# Patient Record
Sex: Female | Born: 1995 | Race: Black or African American | Hispanic: No | Marital: Single | Smoking: Current every day smoker
Health system: Southern US, Community
[De-identification: ages and names within clinical notes are randomized; demographics above are authoritative.]

## PROBLEM LIST (undated history)

## (undated) DIAGNOSIS — J45909 Unspecified asthma, uncomplicated: Secondary | ICD-10-CM

---

## 2013-05-02 ENCOUNTER — Emergency Department (HOSPITAL_COMMUNITY)
Admission: EM | Admit: 2013-05-02 | Discharge: 2013-05-02 | Disposition: A | Discharge status: Home / Self Care | Payer: Medicaid Other | Attending: Emergency Medicine | Admitting: Emergency Medicine

## 2013-05-02 ENCOUNTER — Encounter (HOSPITAL_COMMUNITY): Payer: Self-pay | Admitting: Emergency Medicine

## 2013-05-02 DIAGNOSIS — J45909 Unspecified asthma, uncomplicated: Secondary | ICD-10-CM | POA: Insufficient documentation

## 2013-05-02 DIAGNOSIS — R51 Headache: Secondary | ICD-10-CM | POA: Insufficient documentation

## 2013-05-02 DIAGNOSIS — J069 Acute upper respiratory infection, unspecified: Secondary | ICD-10-CM | POA: Insufficient documentation

## 2013-05-02 DIAGNOSIS — Z79899 Other long term (current) drug therapy: Secondary | ICD-10-CM | POA: Insufficient documentation

## 2013-05-02 HISTORY — DX: Unspecified asthma, uncomplicated: J45.909

## 2013-05-02 LAB — RAPID STREP SCREEN (MED CTR MEBANE ONLY): Streptococcus, Group A Screen (Direct): NEGATIVE

## 2013-05-02 MED ORDER — ALBUTEROL SULFATE HFA 108 (90 BASE) MCG/ACT IN AERS
2.0000 | INHALATION_SPRAY | RESPIRATORY_TRACT | Status: DC | PRN
  Administered 2013-05-02: 2 via RESPIRATORY_TRACT
  Filled 2013-05-02: qty 6.7

## 2013-05-02 NOTE — ED Provider Notes (Signed)
History  This chart was scribed for [name], MD by Ardelia Mems, ED Scribe. This patient was seen in room TR08C/TR08C and the patient's care was started at 3:18 PM.   CSN: [id number]  Arrival date & time 05/02/13  1328   Chief Complaint  Patient presents with  . URI    The history is provided by the patient. No language interpreter was used.    HPI Comments: Jean Navarro is a 17 y.o. female with a h/o asthma who presents to the Emergency Department complaining of 2 days of constant, moderate cold symptoms. Pt reports having a a constant, moderate productive cough of yellow sputum, rhinorrhea, headaches, and nasal congestion. Pt reports being around family members with similar symptoms. Pt is prescribed an albuterol inhaler which she uses when needed. Pt denies fever, chills, vomiting, diarrhea or any other symptoms. Pt denies smoking and alcohol use.  Past Medical History  Diagnosis Date  . Asthma     No past surgical history on file.  No family history on file.  History  Substance Use Topics  . Smoking status: Never Smoker   . Smokeless tobacco: Not on file  . Alcohol Use: No    OB History   Grav Para Term Preterm Abortions TAB SAB Ect Mult Living                  Review of Systems  Constitutional: Negative for fever and chills.  HENT: Positive for congestion and rhinorrhea.   Respiratory: Positive for cough.   Neurological: Positive for headaches.    Allergies  Review of patient's allergies indicates no known allergies.  Home Medications   Current Outpatient Rx  Name  Route  Sig  Dispense  Refill  . albuterol (PROVENTIL HFA;VENTOLIN HFA) 108 (90 BASE) MCG/ACT inhaler   Inhalation   Inhale 2 puffs into the lungs every 6 (six) hours as needed for wheezing.           Triage Vitals: BP 115/71  Pulse 106  Temp(Src) 99 F (37.2 C)  Resp 16  SpO2 99%  Physical Exam  Nursing note and vitals reviewed. Constitutional: She is oriented to person,  place, and time. She appears well-developed and well-nourished.  HENT:  Head: Normocephalic and atraumatic.  TMs are clear bilaterally. Oropharynx without erythema. Bilateral tonsillar swelling without exudates. Uvula midline. Nasal mucosa swollen, without erythema. She has anterior cervical adenopathy.  Eyes: EOM are normal. Pupils are equal, round, and reactive to light.  Neck: Normal range of motion. No tracheal deviation present.  Cardiovascular: Normal rate and normal heart sounds.   No murmur heard. Pulmonary/Chest: Effort normal. No respiratory distress. She has no wheezes. She has no rales.  Abdominal: Soft. There is no tenderness.  Musculoskeletal: Normal range of motion.  Neurological: She is alert and oriented to person, place, and time.  Skin: Skin is warm. No rash noted.  Psychiatric: She has a normal mood and affect.    ED Course  Procedures (including critical care time)  DIAGNOSTIC STUDIES: Oxygen Saturation is 99% on RA, normal by my interpretation.    COORDINATION OF CARE: 3:50 PM- Pt advised of plan for treatment and pt agrees.     Labs Reviewed  RAPID STREP SCREEN  CULTURE, GROUP A STREP   No results found.   No diagnosis found.  1. URI  MDM  URI without complicating symptoms.          I personally performed the services described in this documentation, which  was scribed in my presence. The recorded information has been reviewed and is accurate.     Arnoldo Hooker, PA-C 05/02/13 1608

## 2013-05-02 NOTE — ED Notes (Addendum)
C/o sore throat, cough, nasal drainage x 3 days. States sometimes vomits with coughing spells. Tonsils appear swollen.

## 2013-05-02 NOTE — ED Notes (Signed)
Cold s/s x 2 days h/a and coughing

## 2013-05-03 NOTE — ED Provider Notes (Signed)
Medical screening examination/treatment/procedure(s) were performed by non-physician practitioner and as supervising physician I was immediately available for consultation/collaboration.  [name], MD 05/03/13 [phone]

## 2013-05-04 LAB — CULTURE, GROUP A STREP

## 2013-05-26 ENCOUNTER — Emergency Department (HOSPITAL_COMMUNITY)
Admission: EM | Admit: 2013-05-26 | Discharge: 2013-05-26 | Disposition: A | Discharge status: Home / Self Care | Payer: Medicaid Other | Attending: Emergency Medicine | Admitting: Emergency Medicine

## 2013-05-26 ENCOUNTER — Encounter (HOSPITAL_COMMUNITY): Payer: Self-pay | Admitting: *Deleted

## 2013-05-26 DIAGNOSIS — J45909 Unspecified asthma, uncomplicated: Secondary | ICD-10-CM | POA: Insufficient documentation

## 2013-05-26 DIAGNOSIS — R197 Diarrhea, unspecified: Secondary | ICD-10-CM | POA: Insufficient documentation

## 2013-05-26 DIAGNOSIS — N72 Inflammatory disease of cervix uteri: Secondary | ICD-10-CM | POA: Insufficient documentation

## 2013-05-26 DIAGNOSIS — R112 Nausea with vomiting, unspecified: Secondary | ICD-10-CM | POA: Insufficient documentation

## 2013-05-26 DIAGNOSIS — Z3202 Encounter for pregnancy test, result negative: Secondary | ICD-10-CM | POA: Insufficient documentation

## 2013-05-26 LAB — URINE MICROSCOPIC-ADD ON

## 2013-05-26 LAB — POCT I-STAT, CHEM 8
BUN: 11 mg/dL (ref 6–23)
Calcium, Ion: 1.14 mmol/L (ref 1.12–1.23)
Chloride: 104 meq/L (ref 96–112)
Creatinine, Ser: 1.1 mg/dL (ref 0.50–1.10)
Glucose, Bld: 91 mg/dL (ref 70–99)
HCT: 44 % (ref 36.0–46.0)
Hemoglobin: 15 g/dL (ref 12.0–15.0)
Potassium: 3.4 mEq/L — ABNORMAL LOW (ref 3.5–5.1)
Sodium: 142 meq/L (ref 135–145)
TCO2: 25 mmol/L (ref 0–100)

## 2013-05-26 LAB — URINALYSIS, ROUTINE W REFLEX MICROSCOPIC
Bilirubin Urine: NEGATIVE
Ketones, ur: NEGATIVE mg/dL
Specific Gravity, Urine: 1.021 (ref 1.005–1.030)
Urobilinogen, UA: 1 mg/dL (ref 0.0–1.0)

## 2013-05-26 LAB — CBC WITH DIFFERENTIAL/PLATELET
Basophils Relative: 1 % (ref 0–1)
Eosinophils Relative: 4 % (ref 0–5)
HCT: 41.6 % (ref 36.0–46.0)
Hemoglobin: 14.6 g/dL (ref 12.0–15.0)
MCH: 29.7 pg (ref 26.0–34.0)
MCHC: 35.1 g/dL (ref 30.0–36.0)
MCV: 84.7 fL (ref 78.0–100.0)
Monocytes Absolute: 0.7 10*3/uL (ref 0.1–1.0)
Monocytes Relative: 8 % (ref 3–12)
Neutro Abs: 4.5 10*3/uL (ref 1.7–7.7)
RDW: 11.8 % (ref 11.5–15.5)

## 2013-05-26 LAB — WET PREP, GENITAL
Trich, Wet Prep: NONE SEEN
Yeast Wet Prep HPF POC: NONE SEEN

## 2013-05-26 MED ORDER — MORPHINE SULFATE 4 MG/ML IJ SOLN
4.0000 mg | Freq: Once | INTRAMUSCULAR | Status: AC
  Administered 2013-05-26: 4 mg via INTRAVENOUS
  Filled 2013-05-26: qty 1

## 2013-05-26 MED ORDER — CEFTRIAXONE SODIUM 250 MG IJ SOLR
250.0000 mg | Freq: Once | INTRAMUSCULAR | Status: AC
  Administered 2013-05-26: 250 mg via INTRAMUSCULAR
  Filled 2013-05-26: qty 250

## 2013-05-26 MED ORDER — ONDANSETRON HCL 4 MG/2ML IJ SOLN
4.0000 mg | Freq: Once | INTRAMUSCULAR | Status: AC
  Administered 2013-05-26: 4 mg via INTRAVENOUS
  Filled 2013-05-26: qty 2

## 2013-05-26 MED ORDER — LIDOCAINE HCL (PF) 1 % IJ SOLN
INTRAMUSCULAR | Status: AC
  Filled 2013-05-26: qty 5

## 2013-05-26 MED ORDER — ONDANSETRON HCL 4 MG PO TABS: 4.0000 mg | ORAL_TABLET | Freq: Four times a day (QID) | ORAL | Status: DC

## 2013-05-26 MED ORDER — LIDOCAINE HCL (PF) 1 % IJ SOLN
1.0000 mL | Freq: Once | INTRAMUSCULAR | Status: AC
  Administered 2013-05-26: 1 mL

## 2013-05-26 MED ORDER — SODIUM CHLORIDE 0.9 % IV BOLUS (SEPSIS)
1000.0000 mL | Freq: Once | INTRAVENOUS | Status: AC
  Administered 2013-05-26: 1000 mL via INTRAVENOUS

## 2013-05-26 MED ORDER — AZITHROMYCIN 250 MG PO TABS
1000.0000 mg | ORAL_TABLET | Freq: Once | ORAL | Status: AC
  Administered 2013-05-26: 1000 mg via ORAL
  Filled 2013-05-26: qty 4

## 2013-05-26 NOTE — ED Notes (Signed)
C/o abd. Pain onset 2 days c/o n/v denies urinary sx. Denies vag. D/c.

## 2013-05-26 NOTE — ED Provider Notes (Signed)
Medical screening examination/treatment/procedure(s) were performed by non-physician practitioner and as supervising physician I was immediately available for consultation/collaboration.   Gavin Pound. Niharika Savino, MD 05/26/13 1115
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
## 2013-05-27 LAB — GC/CHLAMYDIA PROBE AMP
CT Probe RNA: POSITIVE — AB
GC Probe RNA: NEGATIVE

## 2013-05-30 NOTE — ED Notes (Addendum)
+   Chlamydia-patient treated with Rocephin and Zithromax-[facility]

## 2013-06-01 ENCOUNTER — Telehealth (HOSPITAL_COMMUNITY): Payer: Self-pay | Admitting: Emergency Medicine

## 2013-06-06 ENCOUNTER — Telehealth (HOSPITAL_COMMUNITY): Payer: Self-pay | Admitting: Emergency Medicine

## 2013-10-14 ENCOUNTER — Emergency Department (HOSPITAL_COMMUNITY): Payer: Medicaid Other

## 2013-10-14 ENCOUNTER — Emergency Department (HOSPITAL_COMMUNITY)
Admission: EM | Admit: 2013-10-14 | Discharge: 2013-10-14 | Disposition: A | Discharge status: Home / Self Care | Payer: Medicaid Other | Attending: Emergency Medicine | Admitting: Emergency Medicine

## 2013-10-14 ENCOUNTER — Encounter (HOSPITAL_COMMUNITY): Payer: Self-pay | Admitting: Emergency Medicine

## 2013-10-14 DIAGNOSIS — R51 Headache: Secondary | ICD-10-CM | POA: Insufficient documentation

## 2013-10-14 DIAGNOSIS — F1210 Cannabis abuse, uncomplicated: Secondary | ICD-10-CM | POA: Insufficient documentation

## 2013-10-14 DIAGNOSIS — F29 Unspecified psychosis not due to a substance or known physiological condition: Secondary | ICD-10-CM | POA: Insufficient documentation

## 2013-10-14 DIAGNOSIS — R413 Other amnesia: Secondary | ICD-10-CM | POA: Insufficient documentation

## 2013-10-14 DIAGNOSIS — J45909 Unspecified asthma, uncomplicated: Secondary | ICD-10-CM | POA: Insufficient documentation

## 2013-10-14 DIAGNOSIS — F17200 Nicotine dependence, unspecified, uncomplicated: Secondary | ICD-10-CM | POA: Insufficient documentation

## 2013-10-14 DIAGNOSIS — Z3202 Encounter for pregnancy test, result negative: Secondary | ICD-10-CM | POA: Insufficient documentation

## 2013-10-14 DIAGNOSIS — T169XXA Foreign body in ear, unspecified ear, initial encounter: Secondary | ICD-10-CM | POA: Insufficient documentation

## 2013-10-14 DIAGNOSIS — N949 Unspecified condition associated with female genital organs and menstrual cycle: Secondary | ICD-10-CM | POA: Insufficient documentation

## 2013-10-14 DIAGNOSIS — Z79899 Other long term (current) drug therapy: Secondary | ICD-10-CM | POA: Insufficient documentation

## 2013-10-14 DIAGNOSIS — R450 Nervousness: Secondary | ICD-10-CM | POA: Insufficient documentation

## 2013-10-14 DIAGNOSIS — T162XXA Foreign body in left ear, initial encounter: Secondary | ICD-10-CM

## 2013-10-14 DIAGNOSIS — Y929 Unspecified place or not applicable: Secondary | ICD-10-CM | POA: Insufficient documentation

## 2013-10-14 DIAGNOSIS — IMO0002 Reserved for concepts with insufficient information to code with codable children: Secondary | ICD-10-CM | POA: Insufficient documentation

## 2013-10-14 DIAGNOSIS — Y939 Activity, unspecified: Secondary | ICD-10-CM | POA: Insufficient documentation

## 2013-10-14 DIAGNOSIS — F411 Generalized anxiety disorder: Secondary | ICD-10-CM | POA: Insufficient documentation

## 2013-10-14 LAB — RAPID URINE DRUG SCREEN, HOSP PERFORMED
Amphetamines: NOT DETECTED
Benzodiazepines: NOT DETECTED
Cocaine: NOT DETECTED
Opiates: NOT DETECTED
Tetrahydrocannabinol: POSITIVE — AB

## 2013-10-14 LAB — URINE MICROSCOPIC-ADD ON

## 2013-10-14 LAB — COMPREHENSIVE METABOLIC PANEL
ALT: 12 U/L (ref 0–35)
AST: 22 U/L (ref 0–37)
CO2: 22 mEq/L (ref 19–32)
Calcium: 9.7 mg/dL (ref 8.4–10.5)
Chloride: 102 mEq/L (ref 96–112)
Creatinine, Ser: 0.92 mg/dL (ref 0.50–1.10)
GFR calc Af Amer: >90 mL/min (ref >90)
GFR calc non Af Amer: 90 mL/min — ABNORMAL LOW (ref >90)
Glucose, Bld: 74 mg/dL (ref 70–99)
Sodium: 138 mEq/L (ref 135–145)
Total Bilirubin: 0.4 mg/dL (ref 0.3–1.2)

## 2013-10-14 LAB — CBC WITH DIFFERENTIAL/PLATELET
Basophils Absolute: 0.1 10*3/uL (ref 0.0–0.1)
Eosinophils Relative: 0 % (ref 0–5)
Hemoglobin: 14.9 g/dL (ref 12.0–15.0)
Lymphs Abs: 1.4 10*3/uL (ref 0.7–4.0)
MCH: 30.7 pg (ref 26.0–34.0)
MCHC: 36.1 g/dL — ABNORMAL HIGH (ref 30.0–36.0)
Monocytes Absolute: 0.7 10*3/uL (ref 0.1–1.0)
Neutrophils Relative %: 84 % — ABNORMAL HIGH (ref 43–77)
Platelets: 205 10*3/uL (ref 150–400)
RDW: 10.8 % — ABNORMAL LOW (ref 11.5–15.5)

## 2013-10-14 LAB — URINALYSIS, ROUTINE W REFLEX MICROSCOPIC
Bilirubin Urine: NEGATIVE
Hgb urine dipstick: NEGATIVE
Specific Gravity, Urine: 1.028 (ref 1.005–1.030)
pH: 7 (ref 5.0–8.0)

## 2013-10-14 MED ORDER — LEVONORGESTREL 0.75 MG PO TABS
1.5000 mg | ORAL_TABLET | Freq: Once | ORAL | Status: AC
  Administered 2013-10-14: 1.5 mg via ORAL
  Filled 2013-10-14: qty 2

## 2013-10-14 MED ORDER — CEFIXIME 400 MG PO TABS
400.0000 mg | ORAL_TABLET | Freq: Once | ORAL | Status: AC
  Administered 2013-10-14: 400 mg via ORAL
  Filled 2013-10-14: qty 1

## 2013-10-14 MED ORDER — AZITHROMYCIN 1 G PO PACK
1.0000 g | PACK | Freq: Once | ORAL | Status: AC
  Administered 2013-10-14: 1 g via ORAL

## 2013-10-14 MED ORDER — AZITHROMYCIN 250 MG PO TABS: 1000.0000 mg | ORAL_TABLET | Freq: Once | ORAL | Status: DC

## 2013-10-14 MED ORDER — METRONIDAZOLE 500 MG PO TABS: 500.0000 mg | ORAL_TABLET | Freq: Once | ORAL | Status: DC

## 2013-10-14 MED ORDER — METRONIDAZOLE 500 MG PO TABS
2000.0000 mg | ORAL_TABLET | Freq: Once | ORAL | Status: AC
  Administered 2013-10-14: 2000 mg via ORAL

## 2013-10-14 MED ORDER — CEFTRIAXONE SODIUM 250 MG IJ SOLR: 250.0000 mg | Freq: Once | INTRAMUSCULAR | Status: DC

## 2013-10-14 MED ORDER — PROMETHAZINE HCL 25 MG PO TABS
25.0000 mg | ORAL_TABLET | Freq: Four times a day (QID) | ORAL | Status: DC | PRN
  Administered 2013-10-14: 75 mg via ORAL
  Filled 2013-10-14: qty 1

## 2013-10-14 MED ORDER — ACETAMINOPHEN 325 MG PO TABS
650.0000 mg | ORAL_TABLET | Freq: Once | ORAL | Status: AC
  Administered 2013-10-14: 650 mg via ORAL
  Filled 2013-10-14: qty 2
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
## 2013-10-14 NOTE — ED Notes (Signed)
SANE RN at bedside.
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
## 2013-10-14 NOTE — ED Notes (Signed)
SANE nurse called.

## 2013-10-16 NOTE — ED Provider Notes (Signed)
Medical screening examination/treatment/procedure(s) were performed by non-physician practitioner and as supervising physician I was immediately available for consultation/collaboration.  EKG Interpretation     Ventricular Rate:  62 PR Interval:  123 QRS Duration: 67 QT Interval:  402 QTC Calculation: 408 R Axis:   81 Text Interpretation:  Sinus rhythm ED PHYSICIAN INTERPRETATION AVAILABLE IN [facility]              Enid Skeens, MD 10/16/13 [phone]

## 2016-07-11 ENCOUNTER — Inpatient Hospital Stay: Admit: 2016-07-11 | Discharge: 2016-07-11 | Disposition: A | Discharge status: Home / Self Care

## 2016-07-11 DIAGNOSIS — N898 Other specified noninflammatory disorders of vagina: Secondary | ICD-10-CM

## 2016-07-11 LAB — TRICHOMONAS SCREEN
Clue Cells, Wet Prep: ABSENT
Trich, Wet Prep: ABSENT

## 2016-07-11 LAB — CHLAMYDIA / GONORRHOEAE DNA SWAB
Chlamydia Trachomatis DNA Swab: NEGATIVE
Neisseria gonorrhoeae DNA Swab: NEGATIVE

## 2016-07-11 LAB — HCG URINE, QUALITATIVE: Preg Test, Ur: NEGATIVE

## 2016-07-11 LAB — TREPONEMA PALLIDUM AB WITH REFLEX: Treponema Pallidum: NEGATIVE

## 2016-07-11 MED ORDER — metroNIDAZOLE (FLAGYL) tablet 2,000 mg
500 | Freq: Once | ORAL | Status: AC
Start: 2016-07-11 — End: 2016-07-11
  Administered 2016-07-11: 16:00:00 2000 mg via ORAL

## 2016-07-11 MED ORDER — ondansetron (ZOFRAN-ODT) disintegrating tablet 4 mg
4 | Freq: Once | ORAL | Status: AC
Start: 2016-07-11 — End: 2016-07-11
  Administered 2016-07-11: 16:00:00 4 mg via ORAL

## 2016-07-11 MED ORDER — fluconazole (DIFLUCAN) tablet 150 mg
150 | Freq: Once | ORAL | Status: AC
Start: 2016-07-11 — End: 2016-07-11
  Administered 2016-07-11: 16:00:00 150 mg via ORAL

## 2016-07-11 MED ORDER — cefTRIAXone (ROCEPHIN) injection 250 mg
250 | Freq: Once | INTRAMUSCULAR | Status: AC
Start: 2016-07-11 — End: 2016-07-11
  Administered 2016-07-11: 16:00:00 250 mg via INTRAMUSCULAR

## 2016-07-11 MED ORDER — azithromycin (ZITHROMAX) tablet 1,000 mg
250 | Freq: Once | ORAL | Status: AC
Start: 2016-07-11 — End: 2016-07-11
  Administered 2016-07-11: 16:00:00 1000 mg via ORAL

## 2016-07-11 MED ORDER — metroNIDAZOLE (FLAGYL) 500 MG tablet
500 | ORAL_TABLET | Freq: Two times a day (BID) | ORAL | Status: AC
Start: 2016-07-11 — End: 2016-07-18

## 2016-07-11 MED FILL — ONDANSETRON 4 MG DISINTEGRATING TABLET: 4 4 MG | ORAL | Qty: 1

## 2016-07-11 MED FILL — FLUCONAZOLE 150 MG TABLET: 150 150 MG | ORAL | Qty: 1

## 2016-07-11 MED FILL — AZITHROMYCIN 250 MG TABLET: 250 250 MG | ORAL | Qty: 4

## 2016-07-11 MED FILL — CEFTRIAXONE 250 MG SOLUTION FOR INJECTION: 250 250 mg | INTRAMUSCULAR | Qty: 250

## 2016-07-11 MED FILL — METRONIDAZOLE 500 MG TABLET: 500 500 MG | ORAL | Qty: 4
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
## 2016-07-11 NOTE — Unmapped (Signed)
Pt presents to [facility] with c/o vaginal discharge x1 month.

## 2016-07-11 NOTE — Unmapped (Signed)
ED Attending Attestation Note    Date of service:  07/11/2016    This patient was seen by the resident physician.  I have seen and examined the patient, agree with the workup, evaluation, management and diagnosis. The care plan has been discussed and I concur.     My assessment reveals a 20 y.o. female with vaginal discharge.  Workup and physical most consistent with bacterial vaginitis.

## 2016-07-11 NOTE — Unmapped (Signed)
Counselor completed Rapid HIV test. Counseling completed. Result Negative

## 2016-07-11 NOTE — Unmapped (Signed)
Thank you for choosing the [facility] Emergency [facility].     You were seen today for vaginal discharge.     When you go home, you will need to take a pill called Flagyl. Take this pill two times a day.     Please follow-up with a primary doctor in 5-7 days. Return to the ED if you develop fevers, severe abdominal pain, persistent vomiting, or other concerning symptoms.

## 2016-11-14 NOTE — Telephone Encounter (Signed)
Pt desires to ask medical questions. Informed pt she needs to contact [facility] where she receives her prenatal care from.

## 2016-11-15 ENCOUNTER — Ambulatory Visit: Admit: 2016-11-15 | Payer: MEDICAID | Attending: Maternal & Fetal Medicine | Primary: Dental Public Health

## 2016-11-15 DIAGNOSIS — O209 Hemorrhage in early pregnancy, unspecified: Secondary | ICD-10-CM

## 2016-11-15 NOTE — Progress Notes (Signed)
OB GYN BILLING ONLY

## 2016-12-11 ENCOUNTER — Ambulatory Visit: Payer: PRIVATE HEALTH INSURANCE | Primary: Dental Public Health

## 2017-01-08 DIAGNOSIS — O26891 Other specified pregnancy related conditions, first trimester: Secondary | ICD-10-CM

## 2017-01-08 MED ORDER — acetaminophen (TYLENOL) tablet 975 mg
325 | Freq: Once | ORAL | Status: AC
Start: 2017-01-08 — End: 2017-01-08
  Administered 2017-01-09: 03:00:00 975 mg via ORAL

## 2017-01-08 MED ORDER — sodium chloride 0.9 % 1,000 mL IV fluid
Freq: Once | INTRAVENOUS | Status: AC
Start: 2017-01-08 — End: 2017-01-08
  Administered 2017-01-09: 03:00:00 via INTRAVENOUS

## 2017-01-08 NOTE — ED Notes (Signed)
Discharge instructions reviewed with pt. No prescription given. Pt ambulates to lobby with a steady gait. A&OX4.

## 2017-01-08 NOTE — ED Triage Notes (Signed)
abd pain and HA for 1 week [redacted] wks pregnant.

## 2017-01-08 NOTE — ED Notes (Signed)
MD at bedside preforming ultrasound.

## 2017-01-08 NOTE — Unmapped (Signed)
Follow-up with your [facility] doctor as directed.  You can take over-the-counter Tylenol as needed for pain.  Drink plenty of fluids.  Return to the emergency department for any concerns.

## 2017-01-08 NOTE — Unmapped (Signed)
[facility] ED Note    Date of service:  01/08/2017    Reason for Visit: Abdominal Pain Pregnant      Patient History     HPI    This is a 21 year old after American female who presents with a headache.  She states she has had the headache intermittently For the last week.  She describes it as right frontal in location.  She rates it as severe.  It was not sudden in onset.  She she states it is not the worse headache of her life.  She last had headaches like this last year.  She denies any recent trauma.  She denies any neck pain or back pain.  She denies any fevers or chills.  She denies any recent sick contacts.  She states that she was concerned that she may have preeclampsia after some reading on the Internet, and came to the emergent permit for evaluation.  She denies any blood pressure problems with this pregnancy.  She states that she has been following up with the [facility] clinic.  She did have a ultrasound at 9 weeks, confirming an intrauterine pregnancy.  She denies any abdominal pain, nausea, vomiting, diarrhea.  No vaginal bleeding or vaginal discharge.  No back pain.  She denies any change in her speech.  No motor weakness or sensory deficits.  She denies changes in her urinary habits as well.     History reviewed. No pertinent past medical history.    Past Surgical History:   Procedure Laterality Date   ??? CARDIAC SURGERY     ??? OVARIAN CYST SURGERY         Patient  reports that she has been smoking Cigars.  She has been smoking about 1.00 pack per day. She does not have any smokeless tobacco history on file. She reports that she uses drugs, including Marijuana. She reports that she does not drink alcohol.      Previous Medications    No medications on file       Allergies:   Allergies as of 01/08/2017 - Fully Reviewed 01/08/2017   Allergen Reaction Noted   ??? Amoxicillin Swelling 07/11/2016       Review of Systems     Review of Systems   Constitutional:  Negative for chills, fatigue and fever.   Eyes: Negative for photophobia and visual disturbance.   Respiratory: Negative for chest tightness and shortness of breath.    Cardiovascular: Negative for chest pain.   Gastrointestinal: Positive for abdominal pain. Negative for diarrhea, nausea and vomiting.   Genitourinary: Negative for difficulty urinating, dysuria, frequency, hematuria, pelvic pain, vaginal bleeding and vaginal discharge.   Musculoskeletal: Negative for neck stiffness.   Skin: Negative for rash.   Neurological: Positive for headaches. Negative for dizziness, facial asymmetry, speech difficulty, weakness, light-headedness and numbness.   Hematological: Does not bruise/bleed easily.           Physical Exam     ED Triage Vitals [01/08/17 1924]   Vital Signs Group      Temp 98.9 ??F (37.2 ??C)      Temp Source Oral      Heart Rate 84      Heart Rate Source Automatic      Resp 20      SpO2 98 %      BP 109/58      BP Location Right arm      BP Method Automatic      Patient Position  Sitting   SpO2 98 %   O2 Device None (Room air)       Physical Exam   Constitutional: She appears well-developed and well-nourished.   HENT:   Head: Normocephalic and atraumatic.   Right Ear: External ear normal.   Left Ear: External ear normal.   Nose: Nose normal.   Mouth/Throat: Oropharynx is clear and moist.   Eyes: EOM are normal. Pupils are equal, round, and reactive to light.   Neck: Normal range of motion. Neck supple.   Cardiovascular: Normal rate, regular rhythm, normal heart sounds and intact distal pulses.  Exam reveals no gallop and no friction rub.    No murmur heard.  Pulmonary/Chest: Effort normal and breath sounds normal. She has no wheezes. She has no rales.   Abdominal: Soft. Bowel sounds are normal. She exhibits no distension. There is no tenderness. There is no rebound and no guarding.   Musculoskeletal: Normal range of motion. She exhibits no edema.   Neurological: She is alert.  She is oriented to person,  place, time and situation.  Normal speech without aphasia or dysarthria.  Moves all extremities spontaneosly and symmetrically.  She has normal strength. No cranial nerve deficit or sensory deficit. Gait is normal without ataxia.  Coordination normal. GCS eye subscore is 4. GCS verbal subscore is 5. GCS motor subscore is 6.   Skin: Skin is warm and dry. No rash noted.   Psychiatric: She has a normal mood and affect. Her behavior is normal.         Diagnostic Studies     Labs:    Please see electronic medical record for any tests performed in the ED    Radiology:    Please see electronic medical record for any tests performed in the ED    EKG:    No EKG Performed    Emergency Department Procedures     Procedures    ED Course and MDM     Christina Mckay is a 21 y.o. female who presented to the emergency department with Abdominal Pain Pregnant      ED Course        This is a 21 year old African female who presents with headache.  The triage note noted abdominal pain, but the patient denies that here today.  She is very well-appearing and does not appear uncomfortable.  She has no evidence of nuchal rigidity or meningismus.  I very low suspicion for severe intracranial pathology, to include masses or hemorrhage.  This is been a headache that has been going on for approximately one week.  She states that it doesn't get better when she drinks fluid or sleep.  She has not tried any over-the-counter medications such as Tylenol.  She is predominately worried about preeclampsia, but her blood pressure is normal here in the emergency department.  We will try hydration and Tylenol.  I do not think that she needed to cranial imaging.  This does not have a characteristic of a subarachnoid hemorrhage.  This is not the worse headache of her life, nor was it sudden in onset.     The patient had a limited bedside ultrasound, that demonstrated a intra-uterine pregnancy with fetal cardiac activity.  The fetus had movement as well.   Patient was given 975 mg of Tylenol, and a liter of IV fluids, with complete resolution of her pain.  I think this is a simple uncomplicated headache, related to hydration and fatigue.  She is urged to get plenty  of rest.  She is urged to eat and drink normally.  She is urged follow-up with a primary care physician and her OB doctor.  Her blood pressures were normal here.  She has no evidence of preeclampsia.  Her labs are normal.  She feels well and is nontoxic, and is discharged in stable condition.       Critical Care Time (Attendings)          Gareth Morgan, MD  01/08/17 [phone]

## 2017-01-09 ENCOUNTER — Inpatient Hospital Stay
Admit: 2017-01-09 | Discharge: 2017-01-09 | Disposition: A | Discharge status: Home / Self Care | Payer: PRIVATE HEALTH INSURANCE

## 2017-01-09 LAB — URINALYSIS W/RFL MICROSCOP, RFL CULTURE
Bilirubin, UA: NEGATIVE
Blood, UA: NEGATIVE
Glucose, UA: NEGATIVE mg/dL
Ketones, UA: NEGATIVE mg/dL
Nitrite, UA: NEGATIVE
Protein, UA: 30 mg/dL
RBC, UA: 1 /HPF (ref 0–3)
Specific Gravity, UA: 1.027 (ref 1.005–1.035)
Squam Epithel, UA: 28 /HPF (ref 0–5)
Urobilinogen, UA: <2 mg/dL (ref 0.2–1.9)
WBC, UA: 5 /HPF (ref 0–5)
pH, UA: 7 (ref 5.0–8.0)

## 2017-01-09 LAB — BASIC METABOLIC PANEL
Anion Gap: 10 mmol/L (ref 3–16)
BUN: 12 mg/dL (ref 7–25)
CO2: 21 mmol/L (ref 21–33)
Calcium: 9.3 mg/dL (ref 8.6–10.3)
Chloride: 105 mmol/L (ref 98–110)
Creatinine: 0.94 mg/dL (ref 0.60–1.30)
Glucose: 78 mg/dL (ref 70–100)
Osmolality, Calculated: 281 mOsm/kg (ref 278–305)
Potassium: 3.5 mmol/L (ref 3.5–5.3)
Sodium: 136 mmol/L (ref 133–146)
eGFR AA CKD-EPI: >90 See note.
eGFR NONAA CKD-EPI: 86 See note.

## 2017-01-09 LAB — DIFFERENTIAL
Basophils Absolute: 42 /uL (ref 0–200)
Basophils Relative: 0.3 % (ref 0.0–1.0)
Eosinophils Absolute: 167 /uL (ref 15–500)
Eosinophils Relative: 1.2 % (ref 0.0–8.0)
Lymphocytes Absolute: 2335 /uL (ref 850–3900)
Lymphocytes Relative: 16.8 % (ref 15.0–45.0)
Monocytes Absolute: 834 /uL (ref 200–950)
Monocytes Relative: 6 % (ref 0.0–12.0)
Neutrophils Absolute: 10522 /uL (ref 1500–7800)
Neutrophils Relative: 75.7 % (ref 40.0–80.0)
nRBC: 0 /100 WBC (ref 0–0)

## 2017-01-09 LAB — HEPATIC FUNCTION PANEL
ALT: 6 U/L — ABNORMAL LOW (ref 7–52)
AST: 14 U/L (ref 13–39)
Albumin: 3.7 g/dL (ref 3.5–5.7)
Alkaline Phosphatase: 17 U/L — ABNORMAL LOW (ref 36–125)
Bilirubin, Direct: 0.1 mg/dL (ref 0.0–0.4)
Bilirubin, Indirect: 0.2 mg/dL (ref 0.0–1.1)
Total Bilirubin: 0.3 mg/dL (ref 0.0–1.5)
Total Protein: 6.8 g/dL (ref 6.4–8.9)

## 2017-01-09 LAB — PREGNANCY QUALITATIVE, SERUM, UCMC ONLY: Preg, Serum: POSITIVE

## 2017-01-09 LAB — CBC
Hematocrit: 36.9 % (ref 35.0–45.0)
Hemoglobin: 13 g/dL (ref 11.7–15.5)
MCH: 31.4 pg (ref 27.0–33.0)
MCHC: 35.3 g/dL (ref 32.0–36.0)
MCV: 88.8 fL (ref 80.0–100.0)
MPV: 8.5 fL (ref 7.5–11.5)
Platelets: 175 10*3/uL (ref 140–400)
RBC: 4.16 10*6/uL (ref 3.80–5.10)
RDW: 12.1 % (ref 11.0–15.0)
WBC: 13.9 10*3/uL (ref 3.8–10.8)

## 2017-01-09 LAB — MAGNESIUM: Magnesium: 1.8 mg/dL (ref 1.5–2.5)

## 2017-01-09 LAB — HCG URINE, QUALITATIVE: Preg Test, Ur: POSITIVE — AB

## 2017-01-19 ENCOUNTER — Other Ambulatory Visit: Admit: 2017-01-19 | Payer: PRIVATE HEALTH INSURANCE | Primary: Dental Public Health

## 2017-01-19 ENCOUNTER — Ambulatory Visit
Admit: 2017-01-19 | Payer: PRIVATE HEALTH INSURANCE | Attending: Maternal & Fetal Medicine | Primary: Dental Public Health

## 2017-01-19 DIAGNOSIS — O35HXX0 Maternal care for other (suspected) fetal abnormality and damage, fetal lower extremities anomalies, not applicable or unspecified: Secondary | ICD-10-CM

## 2017-01-19 DIAGNOSIS — Z3689 Encounter for other specified antenatal screening: Secondary | ICD-10-CM

## 2017-01-19 LAB — CELL FREE FETAL DNA
Chromosome 13: NEGATIVE
Chromosome 18: NEGATIVE
Chromosome 21: NEGATIVE
Y Chromosome: NOT DETECTED

## 2017-01-19 NOTE — Unmapped (Signed)
OBGYN billing only

## 2017-02-05 NOTE — Telephone Encounter (Signed)
Call to inform pt of cffDNA results.  No answer.  LMTRC to [phone].

## 2017-02-19 ENCOUNTER — Ambulatory Visit
Admit: 2017-02-19 | Payer: PRIVATE HEALTH INSURANCE | Attending: Maternal & Fetal Medicine | Primary: Dental Public Health

## 2017-02-19 DIAGNOSIS — O0992 Supervision of high risk pregnancy, unspecified, second trimester: Secondary | ICD-10-CM

## 2017-02-19 NOTE — Progress Notes (Signed)
OB GYN BILLING ONLY

## 2017-03-05 ENCOUNTER — Ambulatory Visit
Admit: 2017-03-05 | Payer: PRIVATE HEALTH INSURANCE | Attending: Maternal & Fetal Medicine | Primary: Dental Public Health

## 2017-03-05 DIAGNOSIS — O365920 Maternal care for other known or suspected poor fetal growth, second trimester, not applicable or unspecified: Secondary | ICD-10-CM

## 2017-03-05 NOTE — Progress Notes (Signed)
OB GYN BILLING ONLY

## 2017-03-13 ENCOUNTER — Ambulatory Visit: Payer: PRIVATE HEALTH INSURANCE | Primary: Dental Public Health

## 2017-03-20 ENCOUNTER — Ambulatory Visit
Admit: 2017-03-20 | Payer: PRIVATE HEALTH INSURANCE | Attending: Maternal & Fetal Medicine | Primary: Dental Public Health

## 2017-03-20 DIAGNOSIS — O365920 Maternal care for other known or suspected poor fetal growth, second trimester, not applicable or unspecified: Secondary | ICD-10-CM

## 2017-03-20 NOTE — Unmapped (Signed)
OBGYN BILLING ONLY

## 2017-03-27 ENCOUNTER — Ambulatory Visit: Payer: PRIVATE HEALTH INSURANCE | Primary: Dental Public Health

## 2017-04-03 ENCOUNTER — Ambulatory Visit
Admit: 2017-04-03 | Payer: PRIVATE HEALTH INSURANCE | Attending: Maternal & Fetal Medicine | Primary: Dental Public Health

## 2017-04-03 DIAGNOSIS — O365930 Maternal care for other known or suspected poor fetal growth, third trimester, not applicable or unspecified: Secondary | ICD-10-CM

## 2017-04-03 NOTE — Unmapped (Signed)
OBGYN billing only

## 2017-04-06 ENCOUNTER — Ambulatory Visit: Payer: PRIVATE HEALTH INSURANCE | Primary: Dental Public Health

## 2017-04-10 ENCOUNTER — Ambulatory Visit
Admit: 2017-04-10 | Payer: PRIVATE HEALTH INSURANCE | Attending: Maternal & Fetal Medicine | Primary: Dental Public Health

## 2017-04-10 DIAGNOSIS — O365930 Maternal care for other known or suspected poor fetal growth, third trimester, not applicable or unspecified: Secondary | ICD-10-CM

## 2017-04-10 NOTE — Unmapped (Signed)
OBGYN billing only

## 2017-04-11 ENCOUNTER — Inpatient Hospital Stay
Admit: 2017-04-11 | Discharge: 2017-04-11 | Disposition: A | Discharge status: Home / Self Care | Payer: PRIVATE HEALTH INSURANCE

## 2017-04-11 DIAGNOSIS — O26893 Other specified pregnancy related conditions, third trimester: Secondary | ICD-10-CM

## 2017-04-11 LAB — URINALYSIS W/RFL MICROSCOP, RFL CULTURE
Bilirubin, UA: NEGATIVE
Blood, UA: NEGATIVE
Glucose, UA: NEGATIVE mg/dL
Ketones, UA: NEGATIVE mg/dL
Nitrite, UA: NEGATIVE
Protein, UA: NEGATIVE mg/dL
RBC, UA: 1 /HPF (ref 0–3)
Specific Gravity, UA: 1.017 (ref 1.005–1.035)
Squam Epithel, UA: 41 /HPF — ABNORMAL HIGH (ref 0–5)
Urobilinogen, UA: <2 mg/dL (ref 0.2–1.9)
pH, UA: 7 (ref 5.0–8.0)

## 2017-04-11 LAB — URINE CULTURE

## 2017-04-11 MED ORDER — cyclobenzaprine (FLEXERIL) 5 MG tablet
5 | ORAL_TABLET | Freq: Three times a day (TID) | ORAL | 0 refills | Status: AC | PRN
Start: 2017-04-11 — End: 2017-04-11

## 2017-04-11 MED ORDER — cyclobenzaprine (FLEXERIL) tablet 10 mg
10 | Freq: Once | ORAL | Status: AC
Start: 2017-04-11 — End: 2017-04-11

## 2017-04-11 MED ORDER — acetaminophen (TYLENOL) tablet 650 mg
325 | Freq: Four times a day (QID) | ORAL | Status: AC | PRN
Start: 2017-04-11 — End: 2017-04-11
  Administered 2017-04-11: 15:00:00 650 mg via ORAL

## 2017-04-11 MED ORDER — cyclobenzaprine (FLEXERIL) 5 MG tablet
5 | ORAL_TABLET | Freq: Three times a day (TID) | ORAL | 0 refills | Status: AC | PRN
Start: 2017-04-11 — End: ?

## 2017-04-11 MED ORDER — cyclobenzaprine (FLEXERIL) 10 MG tablet
10 | ORAL | Status: AC
Start: 2017-04-11 — End: 2017-04-11
  Administered 2017-04-11: 15:00:00 10 via ORAL

## 2017-04-11 MED ORDER — acetaminophen (TYLENOL) 325 MG tablet
325 | ORAL_TABLET | Freq: Four times a day (QID) | ORAL | 0 refills | 11.00000 days | Status: AC | PRN
Start: 2017-04-11 — End: 2017-04-11

## 2017-04-11 MED ORDER — acetaminophen (TYLENOL) 325 MG tablet
325 | ORAL_TABLET | Freq: Four times a day (QID) | ORAL | 0 refills | 11.00000 days | Status: AC | PRN
Start: 2017-04-11 — End: ?

## 2017-04-11 MED FILL — CYCLOBENZAPRINE 10 MG TABLET: 10 10 MG | ORAL | Qty: 1

## 2017-04-11 NOTE — Unmapped (Signed)
[facility] ED OB  Provider Note      Patient Name: Tamikka Pilger  MRN: [id number]  Date of Birth: 13-Sep-1996  Date of Service: 04/11/2017    [facility] Care: [facility]    Subjective:   Mistina Coatney is a 21 y.o. G1P0 at [redacted]w[redacted]d by L=9 wk [place] who presents with back pain. She reports it started 3 days ago. She denies a specific inciting event. She reports trying tylenol with some relief, and took a bath that also helped. She denies dysuria, flank pain, hematuria.  She denies contractions, loss of fluid, vaginal bleeding.  Denies headache, change in vision, RUQ pain.      Antepartum History:  There is no problem list on file for this patient.        OB History:  OB History   Gravida Para Term Preterm AB Living   1             SAB TAB Ectopic Multiple Live Births                  # Outcome Date GA Lbr Len/2nd Weight Sex Delivery Anes PTL Lv   1 Current                   GYN History:  History of abnormal pap smear: denies  History of STI: denies  History of HSV: denies    History:  Past Medical History: No past medical history on file.  Past Surgical History:   Past Surgical History:   Procedure Laterality Date   ??? CARDIAC SURGERY     ??? OVARIAN CYST SURGERY         Social History:   Denies EtOH, tobacco abuse or illicit drug use  Social History     Social History   ??? Marital status: Single     Spouse name: N/A   ??? Number of children: N/A   ??? Years of education: N/A     Occupational History   ??? Not on file.     Social History Main Topics   ??? Smoking status: Current Every Day Smoker     Packs/day: 1.00     Types: Cigars   ??? Smokeless tobacco: Not on file   ??? Alcohol use No   ??? Drug use: Yes     Types: Marijuana   ??? Sexual activity: Not on file     Other Topics Concern   ??? Not on file     Social History Narrative   ??? No narrative on file     Family History: No family history on file.  Denies family history of bleeding or clotting disorders     Medications:  No current facility-administered medications on file prior to encounter.       No current outpatient prescriptions on file prior to encounter.       Allergies:  Allergies   Allergen Reactions   ??? Amoxicillin Swelling       Review of Systems -   All pertinent positives and negatives noted in HPI    Objective:    Vital signs in last 24 hours:  Vitals:    04/11/17 0958   BP: 94/54   Pulse: 98   Resp: 16   Temp: 97.7 ??F (36.5 ??C)   TempSrc: Oral   SpO2: 98%   Height: 5' 1 (1.549 m)       Physical Examination:   Gen: in NAD  HEENT: atraumatic, normocephalic  Neck: supple, no  cervical lymphadenopathy  CV: RRR, no m/r/g  Pulm: CTAB  Abd: Soft, non-distended, gravid uterus, non tender without rebound or guarding. No CVA tenderness  MSK: diffuse paraspinal tenderness,   SVE: 0/0/-2  Ext: warm, well perfused, no edema    Electronic Fetal Monitoring:    Fetal Baseline: 140bpm   Variability: moderate (6-25 beats per minute)   Acceleration: Present   Deceleration: Absent   Uterine Activity: None    Lab Review:    Lab Results   Component Value Date    TREPIA Negative 07/11/2016     Lab Results   Component Value Date    [facility] Negative 07/11/2016     Lab Results   Component Value Date    WBC 13.9 (H) 01/08/2017    HGB 13.0 01/08/2017    HCT 36.9 01/08/2017    MCV 88.8 01/08/2017    PLT 175 01/08/2017       Immunization Status:     There is no immunization history on file for this patient.       Ultrasound: Date: 04/03/17 Gestational Age: [redacted]w[redacted]d EFW 1047g (30%), AC 5%, Presentation cephalic, Placenta Location anterior     Impression/Plan:  Melissa Tomaselli is a 21 y.o. G1P0 at [redacted]w[redacted]d by L=9 wk [place] with back pain.    Back pain  - patient with lower back pain, exam benign  - cervix closed  - pain likely musculoskeletal, improved with flexeril and tylenol  - recommended heating pad to area    Fetal Status   - Category I    Disposition:   - stable for d/c home with return precautions     Instructions:   Preterm labor warnings and fetal movement     Discussed with: Dr. Thresa Ross (Attending)     Genoveva Ill, MD  Resident  04/11/17 [phone]

## 2017-04-11 NOTE — ED Notes (Signed)
Discharge instructions given to pt and  Instructed where to pick up prescriptions.

## 2017-04-11 NOTE — ED Notes (Signed)
Pt to triage c/o constant back pain. Pt reports +FM, denies VB, LOF, ctx pain. Changing at this time

## 2017-04-13 ENCOUNTER — Ambulatory Visit: Payer: PRIVATE HEALTH INSURANCE | Primary: Dental Public Health

## 2017-04-17 ENCOUNTER — Ambulatory Visit: Payer: PRIVATE HEALTH INSURANCE | Primary: Dental Public Health

## 2017-04-20 ENCOUNTER — Ambulatory Visit: Payer: PRIVATE HEALTH INSURANCE | Primary: Dental Public Health

## 2017-04-24 ENCOUNTER — Ambulatory Visit
Admit: 2017-04-24 | Payer: PRIVATE HEALTH INSURANCE | Attending: Maternal & Fetal Medicine | Primary: Dental Public Health

## 2017-04-24 DIAGNOSIS — O365930 Maternal care for other known or suspected poor fetal growth, third trimester, not applicable or unspecified: Secondary | ICD-10-CM

## 2017-04-24 NOTE — Procedures (Signed)
Baby 1  FHR Baseline: 125  Variability: moderate (6-25 beats per minute)  Acceleration: Present  Decelerations Episodic: no  Uterine Contractions: no  Irritability: no  Comments:   External fetal monitoring done and physician notified for interpretation: yes    Patient's last physician office visit is 04/09/17   Next physician office visit is missed yesterday- will reschedule    I have discussed with patient fetal kick counts and provided literature: yes  Importance of compliance with antenatal testing discussed:  yes    Performed by:  Wilfred Lacy

## 2017-04-24 NOTE — Unmapped (Signed)
OBGYN billing only

## 2017-04-24 NOTE — Unmapped (Signed)
NST MFM Interpretation  04/24/2017  10:01 AM    NST Interpretation  Reactive for a BPP of 8/10 (-2 for fetal practice breathing)  Continue antenatal fetal testing as planned.     Amyla Heffner Knox Royalty, MD

## 2017-04-27 ENCOUNTER — Encounter: Payer: PRIVATE HEALTH INSURANCE | Primary: Dental Public Health

## 2017-05-01 ENCOUNTER — Ambulatory Visit: Payer: PRIVATE HEALTH INSURANCE | Primary: Dental Public Health

## 2017-05-03 NOTE — Unmapped (Signed)
Attempted to contact per referral from case manager, Arline Asp, at [facility] for inverted nipple.  Patient is currently 33 weeks.  Left message on voicemail, encouraging patient to call back.

## 2017-05-04 ENCOUNTER — Ambulatory Visit: Payer: PRIVATE HEALTH INSURANCE | Primary: Dental Public Health

## 2017-05-08 ENCOUNTER — Ambulatory Visit
Admit: 2017-05-08 | Payer: PRIVATE HEALTH INSURANCE | Attending: Maternal & Fetal Medicine | Primary: Dental Public Health

## 2017-05-08 DIAGNOSIS — O365930 Maternal care for other known or suspected poor fetal growth, third trimester, not applicable or unspecified: Secondary | ICD-10-CM

## 2017-05-08 NOTE — Unmapped (Signed)
OBGYN billing only

## 2017-05-11 ENCOUNTER — Ambulatory Visit: Payer: PRIVATE HEALTH INSURANCE | Primary: Dental Public Health

## 2017-05-15 ENCOUNTER — Ambulatory Visit: Admit: 2017-05-15 | Payer: PRIVATE HEALTH INSURANCE | Primary: Dental Public Health

## 2017-05-15 ENCOUNTER — Institutional Professional Consult (permissible substitution): Admit: 2017-05-15 | Payer: PRIVATE HEALTH INSURANCE | Attending: Registered" | Primary: Dental Public Health

## 2017-05-15 DIAGNOSIS — Z559 Problems related to education and literacy, unspecified: Secondary | ICD-10-CM

## 2017-05-15 NOTE — Unmapped (Signed)
Education was provided to:  patient    Patient Response:  Expressed understanding and Receptive    Interpreted Used During Visit:  no    Minutes spent on Education: 35    Patient G1P0 presented at 34.6 weeks for prenatal breastfeeding education.  She noted she has been receiving breastfeeding information from [place], Sports coach at [facility] and also [facility].  She was able to name several health benefits breastfeeding provides prior to consult.      All Baby-Friendly prenatal topics were discussed-see prenatal checklist.  Reinforced the importance of exclusive breastfeeding, risks of non-medical supplementation, skin to skin and early breastfeeding, and feeding on cue.  Reviewed proper latch and signs of adequate intake.  Encouraged to delay artificial nipples for at least 3 weeks and until breastfeeding is going well. Discussed breast pump protocol per Luther Redo, will assist with obtaining pump at delivery.  Reviewed [facility] resources for lactation support.    Patient expressed concern regarding inverted nipple.  Consented to breast exam.  Right nipple is inverted, protrudes minimally with stimulation.  Left nipple is everted but is short.  Colostrum noted bilaterally.  Discussed reshaping breast with latch attempt and various interventions (I.e. Nipple shield, shells, pumping prior to latch, etc) if necessary to facilitate latch.  Provided encouragement that breastfeeding can still work.      Patient denies history of breast surgery, PCOS, diabetes, and hypothyroid.  She noted multiple breast changes during pregnancy.    Materials Given:  What to Expect in the First Hour After Delivery,  Diaper Diary, 4 Steps to a [address], Breastfeeding    Georga Kaufmann, MS, RD, LD, [facility]  Lactation Services

## 2017-05-15 NOTE — Unmapped (Signed)
OBGYN billing only

## 2017-05-18 ENCOUNTER — Ambulatory Visit: Payer: PRIVATE HEALTH INSURANCE | Primary: Dental Public Health

## 2017-05-22 ENCOUNTER — Ambulatory Visit: Admit: 2017-05-22 | Discharge: 2017-06-13 | Payer: PRIVATE HEALTH INSURANCE | Primary: Dental Public Health

## 2017-05-22 DIAGNOSIS — O365930 Maternal care for other known or suspected poor fetal growth, third trimester, not applicable or unspecified: Secondary | ICD-10-CM

## 2017-05-22 NOTE — Unmapped (Signed)
OBGYN billing only

## 2017-05-25 ENCOUNTER — Ambulatory Visit: Payer: PRIVATE HEALTH INSURANCE | Primary: Dental Public Health

## 2017-05-29 ENCOUNTER — Ambulatory Visit
Admit: 2017-05-29 | Payer: PRIVATE HEALTH INSURANCE | Attending: Maternal & Fetal Medicine | Primary: Dental Public Health

## 2017-05-29 DIAGNOSIS — O365931 Maternal care for other known or suspected poor fetal growth, third trimester, fetus 1: Secondary | ICD-10-CM

## 2017-05-29 NOTE — Unmapped (Signed)
Patient referred to our Fetal Imaging unit for ultrasound evaluation for testing for possible fetal growth restriction.  She has been found to have oligohydramnios.    She is a 21 y.o. at [redacted]w[redacted]d by 9 week ultrasound.    OB History   Gravida Para Term Preterm AB Living   1             SAB TAB Ectopic Multiple Live Births                  # Outcome Date GA Lbr Len/2nd Weight Sex Delivery Anes PTL Lv   1 Current                   Her pregnancy has been complicated by:  Patient Active Problem List   Diagnosis   ??? Poor fetal growth affecting management of mother in third trimester     Her last growth 05/08/17 EFW of 1720 gm, 12th percentile with an abdominal circumference at the 5th percentile.  She has had normal fluid and testing.  She has had normal Doppler of the umbilical artery.    Current Outpatient Prescriptions on File Prior to Visit   Medication Sig Dispense Refill   ??? acetaminophen (TYLENOL) 325 MG tablet Take 2 tablets (650 mg total) by mouth every 6 hours as needed. 30 tablet 0   ??? cyclobenzaprine (FLEXERIL) 5 MG tablet Take 1 tablet (5 mg total) by mouth 3 times a day as needed for Muscle spasms. 30 tablet 0   ??? PNV62-FA-om3-dha-epa-fish oil 400 mcg-35 mg -25 mg-5 mg Chew Chew by mouth.       No current facility-administered medications on file prior to visit.      Allergies   Allergen Reactions   ??? Amoxicillin Swelling     Estimated body mass index is 18.33 kg/m?? as calculated from the following:    Height as of 07/11/16: 5' 1 (1.549 m).    Weight as of 07/11/16: 97 lb (44 kg).    We performed an ultrasound during today's visit, see separate report for details.  There is oligohydramnios with an amniotic fluid index of borderline low, 5.8 cm.  The BPP is 8/8 and the umbilical artery Doppler is again normal.    I counseled her regarding the risks of oligohydramnios at [redacted]w[redacted]d.  She also has possible fetal growth restriction from placental insufficiency.  Suspected fetal growth restriction with oligohydramnios (AFI  less than 5 cm, no 2 x 2 cm pocket) is an indication for delivery after 37 weeks.  I would consider delivery if she still has an AFI of 5 cm beyond 37 weeks.      1.  IUP at [redacted]w[redacted]d  2.  Oligohydramnios.  I have her returning in 48 hours to have fluid and bpp reexamined.  I would consider delivery if she continues to have low fluid with her underlying fetal growth restriction.    I spent 30 minutes in the management of her care today, including review of records, real-time imaging and face to face counseling,  With over 50% was face to face counseling regarding the above issues and pregnancy complications.    Jaymarion Trombly R. Marty Heck, MD  Maternal Fetal Medicine Faculty

## 2017-05-31 ENCOUNTER — Ambulatory Visit
Admit: 2017-05-31 | Discharge: 2017-06-13 | Payer: PRIVATE HEALTH INSURANCE | Attending: Maternal & Fetal Medicine | Primary: Dental Public Health

## 2017-05-31 DIAGNOSIS — O365930 Maternal care for other known or suspected poor fetal growth, third trimester, not applicable or unspecified: Secondary | ICD-10-CM

## 2017-05-31 NOTE — Unmapped (Signed)
OBGYN BILLING ONLY

## 2017-06-01 ENCOUNTER — Ambulatory Visit: Payer: PRIVATE HEALTH INSURANCE | Primary: Dental Public Health

## 2017-06-02 NOTE — Unmapped (Signed)
Call received from answering service  Patient reporting white vaginal discharge for two days. She is also reporting more pelvic pressure today, unsure if she is having contractions. She denies leakage of fluid, vaginal bleeding. Due to increased pressure, I recommended that she come be evaluated in the [facility] ED on the 3rd floor. She voiced understanding.    Joeseph Amor, MD  PGY1 Ob/Gyn

## 2017-06-05 ENCOUNTER — Ambulatory Visit: Payer: PRIVATE HEALTH INSURANCE | Primary: Dental Public Health

## 2017-06-08 ENCOUNTER — Ambulatory Visit: Payer: PRIVATE HEALTH INSURANCE | Primary: Dental Public Health

## 2017-06-10 NOTE — Progress Notes (Addendum)
Chart Note    Patient Name: Christina Mckay  MRN: [id number]  Date of Birth: 1996/08/17    Primary OBGYN: [facility]    Antepartum course complicated by:  IUGR  Tobacco abuse  Asthma      Obstetrical History  OB History   Gravida Para Term Preterm AB Living   1             SAB TAB Ectopic Multiple Live Births                  # Outcome Date GA Lbr Len/2nd Weight Sex Delivery Anes PTL Lv   1 Current                     Past medical history:     No past medical history on file.      Surgical history:     Past Surgical History:   Procedure Laterality Date    CARDIAC SURGERY      OVARIAN CYST SURGERY              Lab Review:  Blood type: AB +  HGB: 13.0 01/08/17  GBS:  Neg 05/21/17   HIV status: NR 05/02/17, 11/01/16  Trepia: Neg 05/02/17  Chlamydia/Gonorrhea:  Neg 11/01/16  Rubella: immune 11/01/16  GCT: 87 03/01/17  Hep B: Neg 11/01/16    Ultrasound:   05/31/17: 37+1, EFW 2338g (<10%), AC 4%, HC 9%, cephalic, anterior, AFI 10.2, dopplers wnl    Assessment/Plan  Christina Mckay is a 21 y.o. G1P0 who will present at [redacted]w[redacted]d by L=9 week ultrasound for IOL 2/2 IUGR    IUGR  - In ANFS  - PEC w/u wnl, P:C 0.18  - cffDNA 46XX  - Most recent growth [place] as above    Hx tobacco abuse  - Quit 12/2016    Asthma  - Albuterol inhaler prn    GBS neg    Postpartum considerations   - Rh +  - rubella immune  - s/p flu vaccine 12/22/16  - s/p Tdap 05/02/17   - BCM: Nexplanon    Marga Hoots, MD  OBGYN PGY-2

## 2017-06-12 ENCOUNTER — Ambulatory Visit: Payer: PRIVATE HEALTH INSURANCE | Primary: Dental Public Health

## 2017-06-13 ENCOUNTER — Inpatient Hospital Stay
Admit: 2017-06-13 | Discharge: 2017-06-16 | Disposition: A | Discharge status: Home / Self Care | Payer: PRIVATE HEALTH INSURANCE | Source: Ambulatory Visit

## 2017-06-13 DIAGNOSIS — O365930 Maternal care for other known or suspected poor fetal growth, third trimester, not applicable or unspecified: Secondary | ICD-10-CM

## 2017-06-13 LAB — OB URINE DRUG SCREEN REFLEX TO CONFIRMATION
Amphetamine, 500 ng/mL Cutoff: NEGATIVE
Barbiturates UR, 300  ng/mL Cutoff: NEGATIVE
Benzodiazepines UR, 300 ng/mL Cutoff: NEGATIVE
Buprenorphine, 5 ng/mL Cutoff: NEGATIVE
Cocaine UR, 300 ng/mL Cutoff: NEGATIVE
Fentanyl, 2 ng/mL Cutoff: NEGATIVE
Methadone, UR, 300 ng/mL Cutoff: NEGATIVE
Opiates UR, 300 ng/mL Cutoff: NEGATIVE
Oxycodone, 100 ng/mL Cutoff: NEGATIVE
THC UR, 50 ng/mL Cutoff: NEGATIVE
Tricyclic Antidepressants, 300 ng/mL Cutoff: NEGATIVE

## 2017-06-13 LAB — URINALYSIS W/RFL MICROSCOP, RFL CULTURE
Bilirubin, UA: NEGATIVE
Blood, UA: NEGATIVE
Glucose, UA: 50 mg/dL — AB
Ketones, UA: NEGATIVE mg/dL
Leukocyte Esterase, UA: NEGATIVE
Nitrite, UA: NEGATIVE
Protein, UA: NEGATIVE mg/dL
Specific Gravity, UA: 1.013 (ref 1.005–1.035)
Urobilinogen, UA: <2 mg/dL (ref 0.2–1.9)
pH, UA: 7 (ref 5.0–8.0)

## 2017-06-13 LAB — PERINATAL, HIV 1/2 AB+AG WITH REFLEX: HIV 1+2 AB/AGN: NONREACTIVE

## 2017-06-13 LAB — ABO/RH: Rh Type: POSITIVE

## 2017-06-13 LAB — CBC
Hematocrit: 30.7 % — ABNORMAL LOW (ref 35.0–45.0)
Hemoglobin: 10.4 g/dL — ABNORMAL LOW (ref 11.7–15.5)
MCH: 30.2 pg (ref 27.0–33.0)
MCHC: 33.8 g/dL (ref 32.0–36.0)
MCV: 89.4 fL (ref 80.0–100.0)
MPV: 8.9 fL (ref 7.5–11.5)
Platelets: 136 10E3/uL — ABNORMAL LOW (ref 140–400)
RBC: 3.44 10E6/uL — ABNORMAL LOW (ref 3.80–5.10)
RDW: 12.6 % (ref 11.0–15.0)
WBC: 9.1 10E3/uL (ref 3.8–10.8)

## 2017-06-13 LAB — TREPONEMA PALLIDUM AB WITH REFLEX: Treponema Pallidum: NEGATIVE

## 2017-06-13 LAB — ANTIBODY SCREEN: Antibody Screen: NEGATIVE

## 2017-06-13 MED ORDER — bupivacaine (MARCAINE) 0.125%/fentaNYL (SUBLIMAZE) 0.0002% in sodium chloride 0.9% 250 mL intermittent infusion
0.75 | INTRAMUSCULAR | Status: AC
Start: 2017-06-13 — End: 2017-06-14
  Administered 2017-06-14: 02:00:00 12 via EPIDURAL

## 2017-06-13 MED ORDER — misoprostol (CYTOTEC) quarter tablet 25 mcg
25 | Freq: Once | ORAL | Status: AC
Start: 2017-06-13 — End: 2017-06-13

## 2017-06-13 MED ORDER — butorphanol (STADOL) injection 1 mg
1 | Freq: Once | INTRAMUSCULAR | Status: AC
Start: 2017-06-13 — End: 2017-06-13
  Administered 2017-06-13: 21:00:00 1 mg via INTRAVENOUS

## 2017-06-13 MED ORDER — bupivacaine (PF)(SENSORCAINE) 0.25% injection
0.25 | INTRAMUSCULAR | Status: AC | PRN
Start: 2017-06-13 — End: 2017-06-14
  Administered 2017-06-14 (×2): 5 via EPIDURAL

## 2017-06-13 MED ORDER — lactated Ringers 1,000 mL bolus
INTRAVENOUS | Status: AC | PRN
Start: 2017-06-13 — End: 2017-06-14
  Administered 2017-06-13: 1000 mL via INTRAVENOUS

## 2017-06-13 MED ORDER — ondansetron (ZOFRAN) injection 4 mg
4 | Freq: Three times a day (TID) | INTRAMUSCULAR | Status: AC | PRN
Start: 2017-06-13 — End: 2017-06-14

## 2017-06-13 MED ORDER — miSOPROStol (CYTOTEC) tablet 800 mcg
200 | Freq: Once | ORAL | Status: AC | PRN
Start: 2017-06-13 — End: 2017-06-14

## 2017-06-13 MED ORDER — carboprost (HEMABATE) injection 250 mcg
250 | INTRAMUSCULAR | Status: AC | PRN
Start: 2017-06-13 — End: 2017-06-14

## 2017-06-13 MED ORDER — lactated Ringers infusion
INTRAVENOUS | Status: AC
Start: 2017-06-13 — End: 2017-06-14

## 2017-06-13 MED ORDER — oxytocin (PITOCIN) 20 units in lactated ringers 500 mL IV BOLUS
20 | INTRAVENOUS | Status: AC | PRN
Start: 2017-06-13 — End: 2017-06-14
  Administered 2017-06-14: 07:00:00 999 mL/h via INTRAVENOUS

## 2017-06-13 MED ORDER — misoprostol (CYTOTEC) 25 MCG quarter tablet
25 | ORAL | Status: AC
Start: 2017-06-13 — End: 2017-06-13
  Administered 2017-06-13: 17:00:00 25

## 2017-06-13 MED ORDER — methylergonovine (METHERGINE) injection 0.2 mg
0.2 | INTRAMUSCULAR | Status: AC | PRN
Start: 2017-06-13 — End: 2017-06-14

## 2017-06-13 MED ORDER — lidocaine-EPINEPHrine 1.5 %-1:200,000 injection
1.5 | INTRAMUSCULAR | Status: AC | PRN
Start: 2017-06-13 — End: 2017-06-14
  Administered 2017-06-14: 01:00:00 3 via EPIDURAL

## 2017-06-13 MED ORDER — nalbuphine (NUBAIN) injection 2.5 mg
20 | INTRAMUSCULAR | Status: AC | PRN
Start: 2017-06-13 — End: 2017-06-14

## 2017-06-13 MED ORDER — naloxone (NARCAN) injection 0.4 mg
0.4 | INTRAMUSCULAR | Status: AC | PRN
Start: 2017-06-13 — End: 2017-06-14

## 2017-06-13 MED ORDER — lactated Ringers infusion
INTRAVENOUS | Status: AC | PRN
Start: 2017-06-13 — End: 2017-06-14
  Administered 2017-06-13 – 2017-06-14 (×2): 125 mL/h via INTRAVENOUS

## 2017-06-13 MED ORDER — naloxone (NARCAN) injection 0.1 mg
0.4 | INTRAMUSCULAR | Status: AC | PRN
Start: 2017-06-13 — End: 2017-06-14

## 2017-06-13 MED ORDER — oxytocin (PITOCIN) 20 units in lactated ringers 500 mL infusion
20 | INTRAVENOUS | Status: AC
Start: 2017-06-13 — End: 2017-06-14
  Administered 2017-06-14: 06:00:00 2 m[IU]/min via INTRAVENOUS

## 2017-06-13 MED ORDER — proMETHazine (PHENERGAN) injection 12.5 mg
25 | Freq: Once | INTRAMUSCULAR | Status: AC
Start: 2017-06-13 — End: 2017-06-13
  Administered 2017-06-13: 21:00:00 12.5 mg via INTRAVENOUS

## 2017-06-13 MED ORDER — oxytocin (PITOCIN) 20 units in lactated ringers 500 mL IV BOLUS
20 | INTRAVENOUS | Status: AC | PRN
Start: 2017-06-13 — End: 2017-06-14

## 2017-06-13 MED FILL — MISOPROSTOL 25 MCG DOSE: 25 25 MCG | ORAL | Qty: 1

## 2017-06-13 MED FILL — BUPIVACAINE (PF) 0.75 % (7.5 MG/ML) INJECTION SOLUTION: 0.75 0.75 % (7.5 mg/mL) | INTRAMUSCULAR | Qty: 41.65

## 2017-06-13 NOTE — Unmapped (Signed)
Labor and Delivery  History and Physical      Christina Mckay  [id number]      Subjective:   Christina Mckay is a 21 y.o. G1P0 at [redacted]w[redacted]d by L/9 who received her prenatal care at [facility]. Patient presents for IOL for fetal growth restriction.  EFW <10% and AC 4%.  Dopplers have been wnl.  She had normal cell free fetal DNA.  Today she reports normal fetal movement, no VB and no LOF.  She is having irregular ctx.      ROS: no HA, no visual changes, no RUQ pain                Antepartum History:  Patient Active Problem List   Diagnosis   ??? Poor fetal growth affecting management of mother in third trimester   ??? Pregnancy affected by fetal growth restriction         OB History:  OB History   Gravida Para Term Preterm AB Living   1             SAB TAB Ectopic Multiple Live Births                  # Outcome Date GA Lbr Len/2nd Weight Sex Delivery Anes PTL Lv   1 Current                   GYN History:  History of abnormal pap smear: None in records.    History of STI: Negative history.      History:  Past Medical History: Asthma: rare albuterol use.  No hospitalizations or intubations.  No steroids.     Past Surgical History:   -Chest surgery as infant, not cardiac.  Per patient urine was in her thorax.  - Leptoprosopic ovarian cystectom    Social History:   Denies EtOH, tobacco abuse or illicit drug use .  Used tobacco, earlier in pregnancy, but stopped.    Social History     Social History   ??? Marital status: Single     Spouse name: N/A   ??? Number of children: N/A   ??? Years of education: N/A     Occupational History   ??? Not on file.     Social History Main Topics   ??? Smoking status: Former Smoker     Packs/day: 1.00     Types: Cigars   ??? Smokeless tobacco: Never Used   ??? Alcohol use No   ??? Drug use: Yes     Types: Marijuana   ??? Sexual activity: Yes     Partners: Male     Other Topics Concern   ??? Not on file     Social History Narrative   ??? No narrative on file     Family History: No family history on  file.    Medications:  No current facility-administered medications on file prior to encounter.      Current Outpatient Prescriptions on File Prior to Encounter   Medication Sig Dispense Refill   ??? acetaminophen (TYLENOL) 325 MG tablet Take 2 tablets (650 mg total) by mouth every 6 hours as needed. 30 tablet 0   ??? cyclobenzaprine (FLEXERIL) 5 MG tablet Take 1 tablet (5 mg total) by mouth 3 times a day as needed for Muscle spasms. 30 tablet 0   ??? PNV62-FA-om3-dha-epa-fish oil 400 mcg-35 mg -25 mg-5 mg Chew Chew by mouth.         Allergies:  Allergies  Allergen Reactions   ??? Amoxicillin Swelling       Review of Systems - per HPI    Objective:    Vital signs in last 24 hours:  Vitals:    06/13/17 0700   BP: 123/74   Pulse: 94   Resp: 16   Temp: 98 ??F (36.7 ??C)   TempSrc: Oral   SpO2: 99%       Physical Examination:   Gen: in NAD  HEENT: atraumatic, normocephalic  Neck: supple, no cervical lymphadenopathy  CV: RRR, no m/r/g  Pulm: CTAB  Abd: gravid, soft, non-tender, efw 2500 gm  SVE: fingertip/long/firm posterior  Ext: warm, well perfused, no edema      Electronic Fetal Monitoring:    Fetal Baseline: 130 bpm   Variability: moderate (6-25 beats per minute)   Acceleration: Present   Deceleration: Absent   Uterine Activity: None      Cephalic by bedside US     Lab Review:  Blood type: AB +  GBS: negative  HIV: negative  RPR: NR  GC/CT: negative  Rubella: immune  GCT: 87  HepBsAg: negative         Impression/Plan:  Lalah Durango is a 21 y.o. G1P0 at 28'0 IOL for FGR.  1. Induction: misoprostol for cervical ripening     2. Fetal Status   - Category I  - EFW 2500gm  - Cephalic by bedside US     3. GBS negative    4. Nexplanon for birth control    Donell Sievert, MD

## 2017-06-13 NOTE — Unmapped (Signed)
R1 Labor and Delivery Progress Note    21 y.o. G1P0 at [redacted]w[redacted]d by L=9 ultrasound admitted for IOL for IUGR fetus    S: Patient doing well without complaints. Reports mild contractions    O:  Patient Vitals for the past 24 hrs:   BP Temp Temp src Pulse Resp SpO2   06/13/17 1820 132/62 - - 72 - 98 %   06/13/17 1800 - - - 70 - 99 %   06/13/17 1730 119/72 - - 76 18 97 %   06/13/17 1700 - - - 70 - 99 %   06/13/17 1630 109/54 - - 90 18 100 %   06/13/17 1530 113/55 97.9 ??F (36.6 ??C) Oral 80 18 98 %   06/13/17 1430 121/73 - - 63 18 95 %   06/13/17 1330 121/73 - - 61 16 96 %   06/13/17 1315 118/70 - - 70 16 94 %   06/13/17 1300 114/68 - - 79 16 94 %   06/13/17 1245 117/70 98 ??F (36.7 ??C) Oral 72 16 99 %   06/13/17 1127 102/56 97.9 ??F (36.6 ??C) Oral 76 18 99 %   06/13/17 0700 123/74 98 ??F (36.7 ??C) Oral 94 16 99 %       Gen: NAD  Pulm: non-labored respirations  Abd: soft, gravid, non-tender    FHT: 130 bpm, mod variability, accels +, decels -  Toco: every    A/P  21 y.o. G1P0 at [redacted]w[redacted]d by L=9 ultrasound admitted for IOL for IUGR    1. IOL  - cervix c/t/h on presentation, s/p miso x1  - contracting too often for additional miso, foley bulb placed at 1745  - cervix most recently 2/70/-2    2. Fetus  - category 1 tracing  - IUGR, normal dopplers  - EFW 2500g  - cephalic by BSUS    3. BCM  - Nexplanon for [facility]     Jacqualine Code, MD  PGY-3 ObGyn

## 2017-06-13 NOTE — Unmapped (Signed)
Watching patient for assigned nurse

## 2017-06-13 NOTE — Progress Notes (Signed)
Foley balloon removed at this time

## 2017-06-13 NOTE — Unmapped (Signed)
Assumed care of pt at this time. Pt reports +FM. Pt denies CTX, VB, LOF, and pain at this time. VSS. Pt resting comfortably in bed. CLWR, bed in lowest position, and whiteboard updated. RN [employer].

## 2017-06-13 NOTE — Anesthesia Pre-Procedure Evaluation (Signed)
Christina Mckay  DEPARTMENT OF ANESTHESIOLOGY  PRE-PROCEDURAL EVALUATION    Christina Mckay is a 21 y.o. year old female presenting for:        Surgeon:   * Surgery not found *    Chief Complaint     39 WEEKS, INDUCTION    Review of Systems     Anesthesia Evaluation    Nursing notes reviewed.       No history of anesthetic complications   I have reviewed the History and Physical Exam, any relevant changes are noted in the anesthesia pre-operative evaluation.      Cardiovascular:      ROS comment: Hx of cardiac surgery as preterm child    Neuro/Muscoloskeletal/Psych:    (+) back problem (hx of back pain this pregnancy, denies any back pain currently ).      Pulmonary:    (+) asthma (uses albuterol).    ROS comment: Prior smoker- quit 12/2016      GI/Hepatic/Renal:    GERD (mild occasional symptoms, denies taking any medications ) is.    (-) liver disease, renal disease.    Endo/Other:        (-) diabetes mellitus.       Past Medical History     No past medical history on file.    Past Surgical History     Past Surgical History:   Procedure Laterality Date    CARDIAC SURGERY      OVARIAN CYST SURGERY         Family History     No family history on file.    Social History     Social History     Social History    Marital status: Single     Spouse name: N/A    Number of children: N/A    Years of education: N/A     Occupational History    Not on file.     Social History Main Topics    Smoking status: Current Every Day Smoker     Packs/day: 1.00     Types: Cigars    Smokeless tobacco: Not on file    Alcohol use No    Drug use: Yes     Types: Marijuana    Sexual activity: Not on file     Other Topics Concern    Not on file     Social History Narrative    No narrative on file       Medications     Allergies:  Allergies   Allergen Reactions    Amoxicillin Swelling       Home Meds:  Prior to Admission medications as of 04/24/17 0925   Medication Sig Taking?   acetaminophen (TYLENOL) 325 MG tablet Take 2 tablets (650 mg  total) by mouth every 6 hours as needed.    cyclobenzaprine (FLEXERIL) 5 MG tablet Take 1 tablet (5 mg total) by mouth 3 times a day as needed for Muscle spasms.    PNV62-FA-om3-dha-epa-fish oil 400 mcg-35 mg -25 mg-5 mg Chew Chew by mouth.        Inpatient Meds:  Scheduled:   Continuous:    bolus IV fluid      lactated Ringers      lactated Ringers         PRN: carboprost, bolus IV fluid, lactated Ringers, methylergonovine, miSOPROStol, oxytocin (PITOCIN) 20 units in lactated ringers 500 mL, oxytocin (PITOCIN) 20 units in lactated ringers 500 mL    Vital Signs  Wt Readings from Last 3 Encounters:   07/11/16 (!) 97 lb (44 kg)     Ht Readings from Last 3 Encounters:   04/24/17 5' 1 (1.549 m)   04/11/17 5' 1 (1.549 m)   07/11/16 5' 1 (1.549 m)     Temp Readings from Last 3 Encounters:   06/13/17 98 F (36.7 C) (Oral)   04/24/17 96.8 F (36 C)   04/11/17 97.7 F (36.5 C) (Oral)     BP Readings from Last 3 Encounters:   06/13/17 123/74   04/24/17 107/63   04/11/17 94/54     Pulse Readings from Last 3 Encounters:   06/13/17 94   04/24/17 89   04/11/17 98     @LASTSAO2 (3)@    Physical Exam     Airway:     Mallampati: II  Mouth Opening: >2 FB  TM distance: > = 3 FB  Neck ROM: full  (-) no facial hair, neck not short, not intubated      Dental:   - No obvious cracked, loose, chipped, or missing teeth.     Pulmonary:   - normal exam    Breath sounds clear to auscultation.       Cardiovascular:  - normal exam   Rhythm: regular  Rate: normal    Neuro/Musculoskeletal/Psych:  - normal neurological exam.   Mental status: alert and oriented to person, place and time.          Abdominal:       Current OB Status:    Gravida 1   Para 0          Other Findings:        Laboratory Data     Lab Results   Component Value Date    WBC 13.9 (H) 01/08/2017    HGB 13.0 01/08/2017    HCT 36.9 01/08/2017    MCV 88.8 01/08/2017    PLT 175 01/08/2017       No results found for: [employer]    Lab Results   Component Value Date    GLUCOSE 78  01/08/2017    BUN 12 01/08/2017    CO2 21 01/08/2017    CREATININE 0.94 01/08/2017    K 3.5 01/08/2017    NA 136 01/08/2017    CL 105 01/08/2017    CALCIUM 9.3 01/08/2017    ALBUMIN 3.7 01/08/2017    PROT 6.8 01/08/2017    ALKPHOS 17 (L) 01/08/2017    ALT 6 (L) 01/08/2017    AST 14 01/08/2017    BILITOT 0.3 01/08/2017       No results found for: PTT, INR    Lab Results   Component Value Date    PREGTESTUR POSITIVE (A) 01/08/2017    PREGSERUM POSITIVE (A) 01/08/2017       Anesthesia Plan     ASA 2 - emergency           Anesthesia Type:  labor epidural.     (Pt here for IOL for IUGR, consented for labor epidural. )      Anesthetic plan and risks discussed with patient.    Plan, alternatives, and risks of anesthesia, including death, have been explained to and discussed with the patient/legal guardian.  By my assessment, the patient/legal guardian understands and agrees.  Scenario presented in detail.  Questions answered.    Use of blood products discussed with patient whom consented to blood products.   Plan discussed with RNSA, attending and CRNA.

## 2017-06-13 NOTE — Unmapped (Signed)
Fetal monitoring review   06/13/2017    10:31 PM      Christina Mckay 20 y.o. is a G1P0 at [redacted]w[redacted]d who is here for induction of labor for fetal growth restriction     FHR Baseline:  135 bpm  Variability:  moderate (6-25 beats per minute)  Acceleration: Present  Decelerations:  None  Uterine Contractions:  Every 4 minutes     Interpretation: Category I    SVE: 7/90/0 at 2215   Foley bulb removed  Plan to start pitocin     Malachy Mood, MD  OB/GYN PGY-3

## 2017-06-13 NOTE — Unmapped (Addendum)
OB Epidural Placement      Reason for block: labor epidural  Diagnosis:  Labor Pain.  Requesting Physician/Surgeon: Andrey Farmer    Patient location during procedure: L&D room    Pre-procedure Pain Score:    8                Patient position: sitting      Preanesthetic Checklist  Completed: patient identified, anesthesia consent given, pre-op evaluation, timeout performed, IV checked, risks and benefits discussed, monitors and equipment checked/attached to patient, patient being monitored and hand hygiene performed  Anesthesia risks / alternatives discussed pre-op  Questions answered / anesthesia plan accepted and patient agrees to proceed..  Timeout performed at:  20:31. By:  Nolon Lennert verification:  correct patient, correct procedure, correct site and allergies reviewed.    Epidural    Prep: ChloraPrep and site prepped and draped                Approach: midline    Start time: 06/13/2017 8:33 PM    Needle     Injection technique: LOR saline    Needle type: Tuohy     Needle gauge: 17    Needle length: 3.5 in (9 cm)  Type:  lumbar epidural   Level of placement:  L3-4        Needle insertion depth: 4 cm    Catheter at skin depth: 8 cm  Number of attempts:  1        Test Dose Time:  06/13/2017 8:36 PM  Performed after negative aspiration.    Test dose: lidocaine 1.5% with Epi 1:200 K 3 mL(s)     Catheter secured and sterile dressing applied.      Assessment  Events:  blood not aspirated via needle, blood not aspirated via catheter, injection not painful, no injection resistance, no paresthesia with needle placement, no paresthesia with catheter placement, no other event, no positive intravascular test dose, no dural puncture/CSF with needle placement, no dural puncture/CSF with catheter placement and no positive intrathecal test dose                            Block Effect:  No Unexpected/Untoward Events and Successful    Sensory Level, Right: T6  Sensory Level, Left:  T6    Post-procedure Pain Score:  0-No  pain      End time: 06/13/2017 8:45 PM                  Staffing  Anesthesiologist: Gardiner Coins  Resident/CRNA: Carita Pian  Other anesthesia staff: Lavena Stanford  Performed: RNSA

## 2017-06-13 NOTE — Unmapped (Signed)
Foley bulb inserted at 1730

## 2017-06-13 NOTE — Unmapped (Signed)
CC: Christina Mckay is a 21 y/o G1P0 at [redacted]w[redacted]d presenting for IOL due to IUGR.    Pt reports good FM. She denies VB, LF, CTX.      Allergies:  1. Amoxicillin (swelling)    Past Medical History:  1. Asthma- No hx of intubations or steroid use    Past Surgical History:  1. Chest Surgery 1996-06[birth year]  -Was preterm infant. States urine was flowing up into chest and needed repair  2. Ovarian cystectomy (left) 2016    Social History:  Former smoker (0.2 ppd for 6 years) - quit smoking 10/02/2016  Former marijuana user - quit when +UPT    Antepartum History:  -10/27/16: Vaginal bleeding in first trimester (few episodes of spotting, no active VB). Had dark brown spotting after [place] on 11/15/16.  -10/27/16: Cr elevation (1.05). Baseline Cr in preg 0.88. Pt was seen in ED for RLQ pain and spotting.  -11/18/16: Was told baby had small legs on [place]. 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  -01/08/17: In ED c/o white vaginal discharge with odor that started one week ago. No itching. Was prescribed Metronidazole (bacterial vaginosis).  -01/29/17: In ED for c/o HA, had hx of headaches throughout pregnancy starting from 12/22/16.   -05/02/17: Pt was referred to lactation due to inverted nipple and concerns about potential breast feeding difficulties.    Obstetric History:  Pt is G1P0.    Gynecologic History:  1. Abnormal Pap smear on 11/01/16 - ASCUS  2. Gardnerella vaginalis positive 11/01/16    Labs:  1. Rh positive  2. GBS negative  3. HIV Screen non-reactive  4. Treponema Pallidum Screen negative  5. RPR nonreactive  6. HBsAg Screen negative  7. Rubella Antibodies 2.29  8. Gestational Diabetes Screen 87  9. Chlamydia/Gonorrhea Screen negative    Immunizations:   1. Tdap given 05/02/17  2. PPD given 11/01/16  3. Influenza (injectable quadrivalent) given 12/22/2016    Current Medications:  1. Acetaminophen (Tylenol) 325 mg tablet 2 tabs by mouth every 6 hours as needed for pain or fever  2. PNV62-FA-om3-dha-epa-fish oil (Prenatal  Gummy) 400 mcg-35 mcg-25 mg-5mg  chew  3. Albuterol sulfate (Proventil HFA) 90 mcg/actuation inhaler (does not use often)    PE:  Last taken 06/13/2017 0700  T- 98  HR- 94  RR- 16  SpO2- 99  BP- 123/74    Gen- NAD, nervous but well-appearing.  CV- normal rate and rhythm, no m/r/g  Pulm- clear to auscultation bilaterally  Abd- no pain/tenderness on palpation  Ext- no leg swelling     Assessment/Plan:  Christina Mckay is a 21 y/o G1P0 at [redacted]w[redacted]d presenting for IOL due to FGR.   Plan is for cervical ripening using Misoprostol. Will reassess pt in 4 hrs.     Problem List Assessment and Plan  1. Asthma: currently well controlled with albuterol inhaler. Plan to continue management with inhaler.   2. FGR: diagnosed with [place] on 01/31/17. Plan is IOL to address FGR.

## 2017-06-13 NOTE — Unmapped (Signed)
Pt c/o and wanting epidural at this time. Pt rating pain as a 8/10 on the 0-10 pain scale.IVFB going and anesthesia aware.

## 2017-06-13 NOTE — Unmapped (Signed)
Pt admitted to LDR L for IOL.   Oriented to room and white board updated.   Admission assessment completed and papers signed.   Dr. Iona Hansen was at bedside. VE completed and POC reviewed. Pt to eat breakfast and will start IOL when residents return to unit.

## 2017-06-13 NOTE — Unmapped (Signed)
Labor Progress Note    S : Christina Mckay is a 21 y.o. G1P0 at [redacted]w[redacted]d by L/9 who received her prenatal care at [facility]. Patient presents for IOL for fetal growth restriction.  EFW <10% and AC 4%.  Dopplers have been wnl.  no VB and no LOF.  She is having irregular ctx.    ??  ROS: no HA, no visual changes, no RUQ pain  O:   Vitals:    06/13/17 0700 06/13/17 1127 06/13/17 1245 06/13/17 1300   BP: 123/74 102/56 117/70 114/68   Pulse: 94 76 72 79   Resp: 16 18 16 16    Temp: 98 ??F (36.7 ??C) 97.9 ??F (36.6 ??C) 98 ??F (36.7 ??C)    TempSrc: Oral Oral Oral    SpO2: 99% 99% 99% 94%       CV: RRR  Pulm: normal effort, CTAB  Abd: gravid, uterus firm with contractions, soft in between contractions        FHT: 130/mod/+a/-d  - Category -1  Toco: mild irregular contactions    Cervical check-   Dilation: .5 Fingertip  Effacement (%): 50  Cervical Characteristics: Posterior  Station: -1  Method: Manual  OB Examiner: Jayra Choyce    misoprostol 25mg  placed in the posterior fornix as per induction protocol with patients verbal consent.    Dispo:  inpatient L&D   Follow the induction protocol  Inform if any concerns.  Dr. Jenita Seashore informed.    Sandria Manly, MD  PGY-1

## 2017-06-13 NOTE — Unmapped (Signed)
Foley bulb inserted at this time with 60ml of NS.

## 2017-06-14 LAB — VENOUS CORD BLOOD GAS
Base Exc, Cord Ven: -7.8 mmol/L — ABNORMAL LOW (ref <=3.0)
CO2 Content, Cord Ven: 21 mmol/L
Calc Sat - Cord Ven: 55.8 %
HCO3, Cord Ven: 20 mmol/L
pCO2, Cord Ven: 46 mmHg
pH, Cord Ven: 7.24 — ABNORMAL LOW (ref 7.26–7.50)
pO2, Cord Ven: 28 mmHg

## 2017-06-14 MED ORDER — methylergonovine (METHERGINE) injection 0.2 mg
0.2 | INTRAMUSCULAR | Status: AC | PRN
Start: 2017-06-14 — End: 2017-06-16

## 2017-06-14 MED ORDER — carboprost (HEMABATE) injection 250 mcg
250 | INTRAMUSCULAR | Status: AC | PRN
Start: 2017-06-14 — End: 2017-06-16

## 2017-06-14 MED ORDER — magnesium hydroxide (MILK OF MAGNESIA) 2,400 mg/10 mL oral suspension 10 mL
2400 | Freq: Three times a day (TID) | ORAL | Status: AC | PRN
Start: 2017-06-14 — End: 2017-06-16

## 2017-06-14 MED ORDER — lactated Ringers infusion
INTRAVENOUS | Status: AC | PRN
Start: 2017-06-14 — End: 2017-06-16

## 2017-06-14 MED ORDER — prenatal vitamin tablet (OB SELF-ADMIN)
27 | Freq: Every day | ORAL | Status: AC
Start: 2017-06-14 — End: 2017-06-16
  Administered 2017-06-15: 13:00:00 1 via ORAL

## 2017-06-14 MED ORDER — miSOPROStol (CYTOTEC) tablet 800 mcg
200 | Freq: Once | ORAL | Status: AC | PRN
Start: 2017-06-14 — End: 2017-06-14

## 2017-06-14 MED ORDER — benzocaine-menthol (DERMOPLAST) 20-0.5 %
20-0.5 | Freq: Four times a day (QID) | TOPICAL | Status: AC | PRN
Start: 2017-06-14 — End: 2017-06-16

## 2017-06-14 MED ORDER — oxytocin (PITOCIN) 20 units in lactated ringers 500 mL IV BOLUS
20 | INTRAVENOUS | Status: AC | PRN
Start: 2017-06-14 — End: 2017-06-16

## 2017-06-14 MED ORDER — aluminum & magnesium hydroxide-simethicone (MYLANTA, MAALOX) suspension 30 mL
400-400-40 | Freq: After meals | ORAL | Status: AC | PRN
Start: 2017-06-14 — End: 2017-06-16

## 2017-06-14 MED ORDER — hydrocortisone 1 % cream  (OB SELF ADMIN)
1 | Freq: Four times a day (QID) | TOPICAL | Status: AC | PRN
Start: 2017-06-14 — End: 2017-06-16

## 2017-06-14 MED ORDER — oxytocin (PITOCIN) 20 units in lactated ringers 500 mL 20 units/1000 mL BOLUS Soln
20 | INTRAVENOUS | Status: AC
Start: 2017-06-14 — End: 2017-06-14
  Administered 2017-06-14: 05:00:00 1 via INTRAVENOUS

## 2017-06-14 MED ORDER — lactated Ringers infusion
INTRAVENOUS | Status: AC
Start: 2017-06-14 — End: 2017-06-16

## 2017-06-14 MED ORDER — ibuprofen (ADVIL,MOTRIN) tablet (OB SELF-ADMIN) 600 mg
200 | Freq: Four times a day (QID) | ORAL | Status: AC | PRN
Start: 2017-06-14 — End: 2017-06-16

## 2017-06-14 MED ORDER — simethicone (MYLICON) chewable tablet (OB SELF ADMIN) 80 mg
80 | Freq: Four times a day (QID) | ORAL | Status: AC | PRN
Start: 2017-06-14 — End: 2017-06-16

## 2017-06-14 MED ORDER — polyethylene glycol (GLYCOLAX) powder (OB SELF ADMIN) 17 g
17 | Freq: Two times a day (BID) | ORAL | Status: AC | PRN
Start: 2017-06-14 — End: 2017-06-16

## 2017-06-14 MED ORDER — oxytocin (PITOCIN) 20 units in lactated ringers 500 mL IV BOLUS
20 | INTRAVENOUS | Status: AC | PRN
Start: 2017-06-14 — End: 2017-06-16
  Administered 2017-06-14: 08:00:00 125 mL/h via INTRAVENOUS

## 2017-06-14 MED ORDER — acetaminophen (TYLENOL) tablet (OB SELF ADMIN) 650 mg
325 | Freq: Four times a day (QID) | ORAL | Status: AC | PRN
Start: 2017-06-14 — End: 2017-06-16
  Administered 2017-06-14 – 2017-06-16 (×2): 650 mg via ORAL

## 2017-06-14 MED ORDER — oxytocin (PITOCIN) 20 units in lactated ringers 500 mL 20 units/1000 mL BOLUS Soln
20 | INTRAVENOUS | Status: AC
Start: 2017-06-14 — End: 2017-06-14

## 2017-06-14 MED ORDER — docusate sodium (COLACE) capsule (OB SELF ADMIN) 100 mg
100 | Freq: Two times a day (BID) | ORAL | Status: AC | PRN
Start: 2017-06-14 — End: 2017-06-16

## 2017-06-14 MED ORDER — sodium chloride flush 3 mL
INTRAMUSCULAR | Status: AC
Start: 2017-06-14 — End: 2017-06-16
  Administered 2017-06-14: 16:00:00 3 mL via INTRAVENOUS

## 2017-06-14 MED ORDER — lactated Ringers 1,000 mL bolus
INTRAVENOUS | Status: AC | PRN
Start: 2017-06-14 — End: 2017-06-16

## 2017-06-14 MED FILL — MISOPROSTOL 200 MCG TABLET: 200 200 MCG | ORAL | Qty: 4

## 2017-06-14 MED FILL — IBUPROFEN 200 MG TABLET: 200 200 MG | ORAL | Qty: 50

## 2017-06-14 MED FILL — DERMOPLAST (WITH MENTHOL) 20 %-0.5 % TOPICAL AEROSOL: 20-0.5 20-0.5 % | TOPICAL | Qty: 78

## 2017-06-14 MED FILL — ACETAMINOPHEN 325 MG TABLET: 325 325 MG | ORAL | Qty: 200

## 2017-06-14 MED FILL — PHILLIPS' LIQUI-GELS 100 MG CAPSULE: 100 100 mg | ORAL | Qty: 30

## 2017-06-14 MED FILL — OXYTOCIN 20 UNITS IN LACTATED RINGERS 500 ML IV BOLUS: 20 20 units/1000 mL | INTRAVENOUS | Qty: 500

## 2017-06-14 MED FILL — PRENATAL VITS 96-FERROUS FUMARATE 27 MG IRON-FOLIC ACID 800 MCG TABLET: 27 27 mg iron- 800 mcg | ORAL | Qty: 1

## 2017-06-14 NOTE — Unmapped (Signed)
Met with mom to introduce self and discharge case management program.  Infant in NICU at this time. Confirmed with mom that she has a car seat and crib for infant, and all needed baby supplies. Reinforced behaviors/practices that can increase an infant's risk for sleep related deaths with mom and verbal understanding received. Encouraged mom to view postpartum/infant care videos, and newborn channel schedule provided.  Mom undecided. Assessed transportation barriers for follow-up appointments after discharge. Transportation will be rides from others.  Plans for discharge tomorrow/Saturday.  Mom states she has support at home from  family.  Patient scored a 0 on PHQ-2. Reviewed Discharge RN Case Manager contact information, and encouraged to call with questions.    [facility] for nurse visit.  Referrals and orders will be faxed at discharge.  Postpartum follow up appointment for mom at [facility] scheduled for August 20, 3:15pm. Confirmed scheduled appointments with mom. Verified address and phone number.

## 2017-06-14 NOTE — Unmapped (Signed)
Anesthesia Post Note    Patient: Christina Mckay    Procedure(s) Performed: SVD  Anesthesia type: No value filed.    Patient location: Labor and Delivery    Post pain: Adequate analgesia    Post assessment: no apparent anesthetic complications, tolerated procedure well and able to flex hips and bend knees bilateral    Last Vitals:   Vitals:    06/14/17 0340 06/14/17 0355 06/14/17 0410 06/14/17 0425   BP: 107/59 120/57 122/71 115/65   Pulse: 90 93 77 86   Resp:   18    Temp:       TempSrc:       SpO2: 99% 97% 97% 98%        Post vital signs: stable    Level of consciousness: awake, alert  and oriented    Complications: None

## 2017-06-14 NOTE — Unmapped (Signed)
[facility]  VAGINAL DELIVERY NOTE    Name: Christina Mckay   DOB: 09-28-1996  MRN: [id number]     Preprocedure Diagnoses:  1. Intrauterine pregnancy at [redacted]w[redacted]d     Postprocedure Diagnoses: same    Procedure:  1. Spontaneous vaginal delivery    Anesthesia: epidural  Estimated blood loss: 30 cc  Complications: none    Findings:   1. Female infant, Weight pending, apgars pending.  2. No laceration  3. Intact placenta with 3 vessel cord    Procedure details:  This is a 21 y.o. G1P0 at [redacted]w[redacted]d weeks by 9 week ultrasound who was admitted for induction of labor secondary to FGR. After an uncomplicated induction with misoprostol, Foley bulb, and pitocin the patient was found to be complete. Pt pushed with good maternal effort from 10/100/+2 with epidural anesthesia to deliver viable female infant over intact perineum.   Infant head delivered LOA position.  Right anterior shoulder delivered easily with gentle downward traction, followed by left posterior shoulder and body. Cord doubly clamped and cut.  Infant placed on maternal abdomen.  Cord blood and cord gases sent.  Intact placenta with 3VC delivered spontaneously with gentle traction.  Pitocin 20 units given.  Fundus firm.  Cervix, perineum, and vagina explored. No laceration was found.   Fundus still firm at end of procedure.  Sponge and needle count correct at the end of the procedure. Mom stable. Infant transitioning NICU.    Dr. Andrey Farmer in attendance.      Rexene Alberts, MD  OB/GYN PGY-1

## 2017-06-14 NOTE — Unmapped (Signed)
Patient feeling well, remains comfortable with her epidural.     FHT: 145 bpm, moderate, +accels, +variable decel   toco: contractions every 6-8 minutes       Cervix unchanged   Dilation: 7  Effacement (%): 90  Cervical Characteristics: Posterior  Station: 0  Method: Manual  OB Examiner: Isahia Hollerbach      AROM performed without difficulty    Plan to start pitocin for augmentation     Discussed with Dr Farrel Demark, MD  OB/GYN PGY-3  [facility] phone [phone]

## 2017-06-14 NOTE — Unmapped (Signed)
Pt up to BR. Unable to void. Straight cath performed as charted. Peri care provided. Ice pack and mesh underwear in place.

## 2017-06-14 NOTE — Unmapped (Signed)
[facility] Self Medication Assessment    Lanie Schelling  [id number]      Does the patient take care of her own medications at home? Yes    Can the patient open bottles and read labels? Yes    Has the patient displayed any memory problems? No    Does the patient ever forget to take her medications? No    Does the patient speak and read [place] or Spanish? Yes    I have reviewed the above criteria and deem the patient as competent to self medicate unsupervised per Self Medication Policy [id number] [id number]  Appendix A   Yes      Jennette Banker

## 2017-06-14 NOTE — Unmapped (Signed)
Pump and supplies to mother's room. Discussed with RN who will set up and assist with pumping when patient returns to room.    Dyke Brackett, BSN, RN, [facility]

## 2017-06-14 NOTE — Unmapped (Signed)
Problem: Breast Feeding  Goal: Effective breast feeding established  Mother is able to demonstrate proper infant positioning and alignment to promote adequate latching and breastfeeding.      Intervention: Provide pumping equipment/supplies in room  RN educated pt on using pump, how often to pump and to clean supplies after each use. Pt states understanding. No other needs at this time. Stpehanie Montroy ROSE [facility]

## 2017-06-14 NOTE — Unmapped (Signed)
Problem: Vaginal Delivery - Recovery and Postpartum  Goal: Patient vitals and physical assessment findings are stable following delivery  Edema will be absent or minimal  Outcome: Progressing  Patient admitted to 3NW/3367. Infant in NICU. Patient oriented to room and call light. Patient assessment completed. Vital signs stable. Fundus firm and midline. Lochia rubra scant. No complaints of pain at this time. Patient still has some left leg numbness. Instructed to call before getting up. Will continue to monitor and round.

## 2017-06-14 NOTE — Unmapped (Signed)
Delivery Information for Christina Mckay    Labor Events:    Preterm labor: No   Labor onset date:     Labor onset time:     Antenatal steroids: None   Rupture date: 06/13/2017   Rupture time: 11:59 PM   Rupture type: Artificial   Fluid Color: Clear   Dilation complete date: 06/14/2017   Dilation complete time: 2:00 AM   Induction:     Induction indication:     Augmentation: AROM;Oxytocin   Pushing Started: 06/14/2017  2:07 AM   Labor Complications None   Additional Complications:     Cervical ripening: 06/13/2017 12:45 PM    Misoprostol   Antibiotics received during labor? No       Labor Length:    First Stage:   hours,   minutes    Second Stage:   hours,   minutes   Third Stage:   hours,   minutes       Delivery:    Episiotomy: None       Laceration Repaired?   Perineal: None     Periurethral:       Labial:       Sulcus:       Vaginal: No     Cervical: No          Repair suture: None   Repair # of packets:     Blood loss (ml):     Counts          Forceps attempted? No   Vacuum extractor attempted No   Indications:     Forceps application location:         Asst Del Details (If applicable): No data filed           Presentation/Position: Compound; Left Occiput Anterior      Anesthesia:    Method:  Epidural   Analgesics:           Other Procedures: None    Birth information:  Date of birth: 06/14/2019   Time of birth: 3:11 AM   Sex: female   Delivery type: Vaginal, Spontaneous Delivery   Breech type (if applicable):     Observed anomalies/comments       Gestational Age: [redacted]w[redacted]d      Living? Living          APGARS  One minute Five minutes Ten minutes Fifteen minutes Twenty minutes   Skin color: 0   1             Heart rate: 2   2             Grimace: 2   2              Muscle tone: 2   2              Breathing: 2   2              Totals: 8   9                  Shoulder Dystocia:   Shoulder Dystocia    Shoulder dystocia present:  No   Delivery of posterior arm:       Fetal clavicular fracture:       Gaskin maneuver:        McRoberts maneuver:       Rubin maneuver:       Suprapubic pressure:       Symphysiotomy:  Woods screw maneuver:       Zavanelli maneuver:                            Newborn Measurements:  Weight: 2255 g (4 lb 15.5 oz)    Length:      Head circumference (in):      Chest circumference (in):     Observed anomalies:     Date of Fetal Demise:   Weight:  79.5 oz           Cord Information: 3 vessels     Disposition of cord blood: Lab    Blood gases sent? Yes  Complications: None   Cord clamped date/time:      Resuscitation: Suctioning;Oxygen     Placenta:  Delivered: 7/12  3:19 AM  Appearance: Intact  Removal: Spontaneous    Disposition: pathology       Delivery Providers:    Delivery Clinician: Demaris Callander    Other Providers: Valetta Close, ERIN WILSON;KEIHN, REBECCA JORDYN;MARCIANO, COURTNEY MARIE;FLAMM, Starleen Arms M Resident;Resident;Registered Nurse;Baby Nurse;Charge Nurse;Technician

## 2017-06-15 ENCOUNTER — Encounter: Payer: PRIVATE HEALTH INSURANCE | Primary: Dental Public Health

## 2017-06-15 LAB — CBC
Hematocrit: 31.2 % (ref 35.0–45.0)
Hemoglobin: 10.5 g/dL (ref 11.7–15.5)
MCH: 30 pg (ref 27.0–33.0)
MCHC: 33.6 g/dL (ref 32.0–36.0)
MCV: 89.3 fL (ref 80.0–100.0)
MPV: 9.8 fL (ref 7.5–11.5)
Platelets: 134 10*3/uL (ref 140–400)
RBC: 3.5 10*6/uL (ref 3.80–5.10)
RDW: 12.6 % (ref 11.0–15.0)
WBC: 13.9 10*3/uL (ref 3.8–10.8)

## 2017-06-15 MED ORDER — polyethylene glycol (GLYCOLAX) 17 gram/dose powder
17 | Freq: Two times a day (BID) | ORAL | 0 refills | 14.00000 days | Status: AC | PRN
Start: 2017-06-15 — End: ?

## 2017-06-15 MED ORDER — ibuprofen (ADVIL,MOTRIN) 200 MG tablet
200 | ORAL_TABLET | Freq: Four times a day (QID) | ORAL | 0 refills | Status: AC | PRN
Start: 2017-06-15 — End: 2018-01-18

## 2017-06-15 MED ORDER — docusate sodium (COLACE) 100 MG capsule
100 | ORAL_CAPSULE | Freq: Two times a day (BID) | ORAL | 0 refills | Status: AC | PRN
Start: 2017-06-15 — End: ?

## 2017-06-15 NOTE — Unmapped (Signed)
This note was copied from a baby's chart.    NICU LACTATION CONSULTATION     Lactation Consult for Mother with Infant in NICU      Date of birth: [birth year]    Time of Birth: 0311   Gestational age:   8w 1d Birth Weight:   2255 g       Maternal Assessment:     Maternal Data:  Information for the patient's mother:  Raneen, Jaffer [[id number]]   22 y.o.    Gravida/Para:   Information for the patient's mother:  Jamarie, [place] [[id number]]   G1P1001     Information for the patient's mother:  Dejanira, [place] [[id number]]   [redacted]w[redacted]d    Prenatal Breastfeeding Education:  nurse in clinic  Prior Breastfeeding Experience: none  Goal after education: directly breastfeed.  Mother has been breastfeeding baby in the NICU.    Breast Assessment:  Breasts are: WDL  Nipples: left everts well.  Right appears flat with fold in nipple. Right nipple everted with pumping.   Areola: WDL  Nipple comfort: tender   Nipple Integrity: cracked on both nipples.  Nipples red.    Medications: Medications acceptable for breastfeeding.    Maternal Toxicology Screen:  Negative 06/13/17  (these are presumptive results and positive results should be sent for confirmation by Obstetrician)  Other significant maternal history:  Tobacco use    Risk Factors: smoking and separation from baby  Transportation: not addressed    Delivery Information:  Born on 06/14/2019 at 3:11 AM    Delivery method: Vaginal, Spontaneous Delivery [250]    Initial pump session    18 hours after birth, by RN and PP.  This was a goal change for mother.  Her initial intention was to formula feed.      Intervention during consultation:   Interventions performed: electric breast pump, hands free bra, hand expression, lanolin provided and instructed and syringe feeding  Manual Expression: MOB demonstrates well and MOB states she understands  Bedside breast pump: Electric breast pump, set up and instructed and assisted with pump session  Amount of milk expressed: 5 ml  Pump arrangements: Faxed to  Pumps for Mom: 06/15/17    Education: frequency, warm compresses, hand expression, cleaning and sanitizing pump parts, STS, labeling and storage and transport.     Resource information given/ discussed with patient:  ??? Post discharge breastfeeding resources provided- Providing Mothers Milk,   ??? Discussed resources for post discharge follow up with NICU lactation as needed, After-hours lactation phone  ??? [facility] lactation services for breastfeeding help after discharge    Plan:     Recommend: Pump 8 or more times in 24 hours, hand express afterward, use warm compresses until milk is flowing well, clean pump parts after each use, use miccrowave steam bag daily, obtain more supplies from NICU including cooler bag, STS as baby tolerates and pump at baby's bedside    Mother's response: active in care and verbalized understanding.    Dyke Brackett, BSN, RN, [facility]

## 2017-06-15 NOTE — Unmapped (Signed)
Postpartum Progress Note    PPD#1 s/p SVD    Overnight events:    S: Pt feeling well, no complaints. Reports mild back pain at site of epidural and right ankle pain worsened by movement, rates as a 6/10. Pain is otherwise well controlled with medications. Ambulating without difficulty, tolerating regular diet without nausea or vomiting. Has had a bowel movement.  Lochia minimal.      Denies headaches, change in vision, RUQ pain, SOB, chest pain, nausea, vomiting, dizziness, light-headedness.     O:   Vitals:    06/14/17 1139 06/14/17 1759 06/14/17 2000 06/15/17 0200   BP: 117/81 110/61 106/62 107/63   BP Location: Left arm Right arm Right arm Right arm   Patient Position: Lying Lying Lying Lying   Pulse: 64 72 74 68   Resp: 16 16 17 16    Temp: 98.2 ??F (36.8 ??C) 98.1 ??F (36.7 ??C) 98 ??F (36.7 ??C) 98.8 ??F (37.1 ??C)   TempSrc: Oral Oral     SpO2: 100% 100% 99% 100%       Gen: NAD, A&Ox3  CV: warm, well perfused  Resp: non labored  Abd: soft, NT, ND, fundus firm below umbilicus     Lab Results   Component Value Date    WBC 9.1 06/13/2017    HGB 10.4 (L) 06/13/2017    HCT 30.7 (L) 06/13/2017    MCV 89.4 06/13/2017    PLT 136 (L) 06/13/2017         Lab Results   Component Value Date    RH Positive 06/13/2017       A/P: 21 y.o. G1P1001 PPD#1 s/p SVD    PPD#1:   - Meeting postpartum milestones.   - Continue current care.    Female infant: 81g, apgars8/9  - Feeding via bottle, is open to breastfeeding however  - Lactation consult    Birth control method:    -desires Nexplanon, needs consent    Disposition:   -Inpatient for continued observation    Rexene Alberts, MD  OB/GYN PGY-1

## 2017-06-15 NOTE — Unmapped (Signed)
Delivery Information for Christina Mckay    Labor Events:    Preterm labor: No   Labor onset date:     Labor onset time:     Antenatal steroids: None   Rupture date: 06/13/2017   Rupture time: 11:59 PM   Rupture type: Artificial   Fluid Color: Clear   Dilation complete date: 06/14/2017   Dilation complete time: 2:00 AM   Induction:     Induction indication:     Augmentation: AROM;Oxytocin   Pushing Started: 06/14/2017  2:07 AM   Labor Complications None   Additional Complications:     Cervical ripening: 06/13/2017 12:45 PM    Misoprostol   Antibiotics received during labor? No       Labor Length:    First Stage:   hours,   minutes    Second Stage: 1 hours, 11 minutes   Third Stage: 0 hours, 8 minutes       Delivery:    Episiotomy: None       Laceration Repaired?   Perineal: None     Periurethral:       Labial:       Sulcus:       Vaginal: No     Cervical: No          Repair suture: None   Repair # of packets:     Blood loss (ml):     Counts          Forceps attempted? No   Vacuum extractor attempted No   Indications:     Forceps application location:         Asst Del Details (If applicable): No data filed           Presentation/Position: Compound; Left Occiput Anterior      Anesthesia:    Method:  Epidural   Analgesics:           Other Procedures: None    Birth information:  Date of birth: 06/14/2019   Time of birth: 3:11 AM   Sex: female   Delivery type: Vaginal, Spontaneous Delivery   Breech type (if applicable):     Observed anomalies/comments       Gestational Age: [redacted]w[redacted]d      Living? Living          APGARS  One minute Five minutes Ten minutes Fifteen minutes Twenty minutes   Skin color: 0   1             Heart rate: 2   2             Grimace: 2   2              Muscle tone: 2   2              Breathing: 2   2              Totals: 8   9                  Shoulder Dystocia:   Shoulder Dystocia    Shoulder dystocia present:  No   Delivery of posterior arm:       Fetal clavicular fracture:       Gaskin maneuver:        McRoberts maneuver:       Rubin maneuver:       Suprapubic pressure:       Symphysiotomy:       Joseph Art  screw maneuver:       Zavanelli maneuver:                            Newborn Measurements:  Weight: 2255 g (4 lb 15.5 oz)    Length:      Head circumference (in):      Chest circumference (in):     Observed anomalies:     Date of Fetal Demise:   Weight:  79.5 oz           Cord Information: 3 vessels     Disposition of cord blood: Lab    Blood gases sent? Yes  Complications: None   Cord clamped date/time:      Resuscitation: Suctioning;Oxygen     Placenta:  Delivered: 7/12  3:19 AM  Appearance: Intact  Removal: Spontaneous    Disposition: pathology       Delivery Providers:    Delivery Clinician: Demaris Callander    Other Providers: Valetta Close, ERIN WILSON;KEIHN, REBECCA JORDYN;MARCIANO, COURTNEY MARIE;FLAMM, Starleen Arms M Resident;Resident;Registered Nurse;Baby Nurse;Charge Nurse;Technician         Obstetrics Discharge Summary    Patient Name: Christina Mckay  [id number]  [id number]    Date of admission: 06/13/2017  Date of discharge: 7/14/201    Attending: Reinaldo Raddle, MD     Diagnoses Present on Admission  1. IUP at [redacted]w[redacted]d    2. Fetal growth restriction  3. Tobacco use in pregnancy  4. Asthma with rare albuterol use     Diagnosis Present on Discharge  1. IUP at [redacted]w[redacted]d s/p SVD  2. Asthma - rare albuterol use  3. Tobacco use   Procedures performed  1. Spontaneous vaginal delivery    Consultations  1. Lactation    Discharge medications:      Medication List      TAKE these medications, which are NEW      Quantity/Refills   docusate sodium 100 MG capsule  Commonly known as:  COLACE  Take 1 capsule (100 mg total) by mouth 2 times a day as needed for Constipation.   Quantity:  60 capsule  Refills:  0     ibuprofen 200 MG tablet  Commonly known as:  ADVIL,MOTRIN  Take 3 tablets (600 mg total) by mouth every 6 hours as needed.   Quantity:  30 tablet  Refills:  0     polyethylene glycol 17 gram/dose  powder  Commonly known as:  GLYCOLAX  Take 17 g by mouth 2 times a day as needed (constipation).   Quantity:  255 g  Refills:  0        TAKE these medications, which you were ALREADY TAKING      Quantity/Refills   acetaminophen 325 MG tablet  Commonly known as:  TYLENOL  Take 2 tablets (650 mg total) by mouth every 6 hours as needed.   Quantity:  30 tablet  Refills:  0     cyclobenzaprine 5 MG tablet  Commonly known as:  FLEXERIL  Take 1 tablet (5 mg total) by mouth 3 times a day as needed for Muscle spasms.   Quantity:  30 tablet  Refills:  0     [id number]-dha-epa-fish oil 400 mcg-35 mg -25 mg-5 mg Chew  Chew by mouth.   Refills:  0           Where to Get Your Medications      These medications were sent to [id number] - [place], [place] - [address] AT [facility] [address] [place] [address]    Phone:  [phone]   ?? docusate sodium 100 MG capsule  ?? ibuprofen 200 MG tablet  ?? polyethylene glycol 17 gram/dose powder         Allergy:  Allergies   Allergen Reactions   ??? Amoxicillin Swelling         Indications for admission  This patient is a 21 y.o. G1P1001 who presented at [redacted]w[redacted]d for IOL for fetal growth restriction. EFW <10% and AC 4%.     Hospital Course  1. Patient presented to L&D at [redacted]w[redacted]d by 9 week ultrasound and was admitted for induction of labor secondary to FGR. Patient subsequently underwent an uncomplicated induction with misoprostol and Foley bulb and underwent spontaneous vaginal delivery.  Please see procedure note for details.  Patient's postpartum course was uncomplicated.      Patient was able to ambulate, void without difficulty, tolerating regular diet, and pain well controlled with oral medications.  She was discharged on postpartum day 2 with instructions to return with heavy vaginal bleeding, uncontrolled abdominal pain, temperature >100.3, vaginal discharge, or other concerning signs or symptoms.  Patient will have follow up in 5 weeks at  [facility].      Rh status: pos    Rubella status: Immune    Birth control method: Depo, Nexplanon at postpartum visit.     Postpartum CBC:   Lab Results   Component Value Date    WBC 9.1 06/13/2017    HGB 10.4 (L) 06/13/2017    HCT 30.7 (L) 06/13/2017    MCV 89.4 06/13/2017    PLT 136 (L) 06/13/2017        Breastfeeding: plans to breastfeed    Infant Information: Please see above delivery summary    Discharge Instructions  1. Condition on discharge - stable  2. Activity - pelvic rest x 6 weeks.    3. Diet - regular  4. Patient to return with uncontrolled abdominal pain, fevers >100.3, uncontrolled nausea/vomiting, vaginal bleeding soaking through 1 pad per hour x 2 hours, or any other concerning symptoms.  5. Follow up - 5 weeks at [facility] for postpartum visit    [facility]  [address]  [place] [address]  [phone]    Go on 07/23/2017  3:15pm for postpartum visit

## 2017-06-15 NOTE — Unmapped (Signed)
Pt assessment completed and charted. VSS. Up at lib. Pt c/o HA and took self-meds per MAR. No other issues at this time. Will continue to monitor. Camran Keady M Rily Nickey    Vitals:    06/15/17 2340   BP: 125/79   Pulse: 72   Resp: 18   Temp: 98.4 ??F (36.9 ??C)   SpO2: 100%

## 2017-06-15 NOTE — Unmapped (Signed)
Assessment complete and within normal limits. Vital signs stable. Fundus firm and midline. Bleeding rubra and scant amount. Denies pain at this time. All needs met at this time. Call light within reach. Will continue to monitor.

## 2017-06-15 NOTE — Unmapped (Signed)
Addendum  created 06/15/17 1724 by [place] Mischa Pollard, RNSA    Anesthesia Event edited, Sign clinical note

## 2017-06-15 NOTE — Unmapped (Signed)
[place]   Social Work Psychosocial Assessment     Azaylah Stailey  [id number]  21 y.o.  female  Black or African American       38 WEEKS, INDUCTION    Referred by: Census  Referred Reason: NICU infant      History    History reviewed. No pertinent past medical history.    History   Drug Use   ??? Types: Marijuana       History   Alcohol Use No       Mental Health    Current Mental Health Concerns: No  Mental Health History: none    Mental Status:AAOx3                Chemical Dependency    Drug Screen Results: negative    Current Living Arrangements        She lives at [address] [phone]. She is currently living with a friend and plans to get a place of her own.                   Support Systems    Name: Charissa Bash her grandma  Contact Info: [phone]  Narrative: She has support from family and FOB      Cultural/Spiritual/Language Barriers     no ne noted    Other Pertinent Data    Financial Situation:  WIC, food stamps and plans to apply for cash                  Father of Baby (FOB):Sedric Darin  FOB DOB:24  Infant Name: Honesty Darin  Infant Weight: 4 # 15.5 ounces  Gestation: 39   Infant DOB: [birth year]  Pediatrician: Madilyn Fireman    Social Work Interventions    Interventions: Education/Information    Assessment/Plan    Sw reviewed chart and explained Sw role to patient. She is a 21 year old, single AA female. She was at NICU bedside holding infant. She appears to be coping well and is hoping infant will get to come to her room. She has all of her supplies and denied any post partum. She had no other issues or concerns. Sw educated her on post partum and she was aware.     Patient/Family aware and taking part in the discharge plan.  Patient and family were offered a post-acute provider list as applicable to the discharge plan and insurance provider.  Patient and family were given the freedom to choose providers and financial interest(s) were disclosed as appropriate.    This  assessment has been reviewed with the multi-disciplinary team.

## 2017-06-15 NOTE — Unmapped (Signed)
Labor and Delivery Pain Management Follow-up Note    Patient: Christina Mckay    Procedure(s) Performed: svd    Anesthesia type: epidural    Pain Assessment:  pain well controlled    Vital Signs:    Vitals:    06/15/17 0832   BP: 109/53   Pulse: 72   Resp: 18   Temp: 97.4 ??F (36.3 ??C)   SpO2: 98%       OB Anesthesia Complications: None

## 2017-06-15 NOTE — Unmapped (Signed)
See previous lactation note for consult.     Breast pump approved and distributed to mother of baby.    Dyke Brackett, BSN, RN, [facility]

## 2017-06-16 MED ORDER — medroxyPROGESTERone (DEPO-PROVERA) injection 150 mg
150 | Freq: Once | INTRAMUSCULAR | Status: AC
Start: 2017-06-16 — End: 2017-06-16
  Administered 2017-06-16: 22:00:00 150 mg via INTRAMUSCULAR

## 2017-06-16 MED FILL — MEDROXYPROGESTERONE 150 MG/ML INTRAMUSCULAR SUSPENSION: 150 150 mg/mL | INTRAMUSCULAR | Qty: 1

## 2017-06-16 NOTE — Progress Notes (Signed)
Home per wheelchair accompanied by S.O. And infant daughter.  Pt. Denies pain.

## 2017-06-16 NOTE — Unmapped (Signed)
Postpartum Progress Note    PPD#2 s/p SVD    Overnight events:    S: Pt feeling well, no complaints. Reports mild back pain. Pain is otherwise well controlled with medications. Ambulating without difficulty, tolerating regular diet without nausea or vomiting. Has had a bowel movement.  Lochia minimal.      Denies headaches, change in vision, RUQ pain, SOB, chest pain, nausea, vomiting, dizziness, light-headedness.     O:   Vitals:    06/15/17 0200 06/15/17 0832 06/15/17 1722 06/15/17 2340   BP: 107/63 109/53 106/63 125/79   BP Location: Right arm Right arm Right arm Right arm   Patient Position: Lying Lying Lying Lying   Pulse: 68 72 61 72   Resp: 16 18 18 18    Temp: 98.8 ??F (37.1 ??C) 97.4 ??F (36.3 ??C) 97.4 ??F (36.3 ??C) 98.4 ??F (36.9 ??C)   TempSrc:  Oral Oral Oral   SpO2: 100% 98% 96% 100%       Gen: NAD, A&Ox3  CV: warm, well perfused  Resp: non labored  Abd: soft, appropriately tender, ND, fundus firm below umbilicus     Lab Results   Component Value Date    WBC 13.9 (H) 06/15/2017    HGB 10.5 (L) 06/15/2017    HCT 31.2 (L) 06/15/2017    MCV 89.3 06/15/2017    PLT 134 (L) 06/15/2017         Lab Results   Component Value Date    RH Positive 06/13/2017       A/P: 21 y.o. G1P1001 PPD#1 s/p SVD    PPD#1:   - Meeting postpartum milestones.   - Continue current care.    Female infant: 28g, apgars8/9  - Feeding via bottle and breast  - Lactation consult    Birth control method:    -desires Nexplanon, needs consent    Disposition:   -discharge to home    Alycia Rossetti, MD  OB/GYN PGY-1

## 2017-06-16 NOTE — Progress Notes (Signed)
Pt out of room visiting infant in NICU

## 2017-06-16 NOTE — Unmapped (Signed)
[facility]    Obstetric Discharge Instructions        ACTIVITY:    ?? Avoid lifting anything over 10 pounds, driving, using the sweeper or leaving your home for an extended period of time. You may resume activities that you feel are appropriate                                                   ?? No exercise until you follow-up with your doctor                                             ?? You may go up and down stairs as necessary    ?? You should shower daily    ?? After your delivery, you may return to work/school/normal activity as directed by your physician on your follow-up visit    DIET:    ?? Maintain good dietary habits. Include the basic food groups: meats, dairy, cereals, fruits and vegetables    ?? If breastfeeding, increase your calories and fluids. See specific diet for breast feeding mothers located in your breastfeeding handouts                PERINEAL CARE:    ?? You will be sent home with a peri-bottle (squeeze bottle) to cleanse the perineum (vaginal area) after each time you urinate of have a bowel movement    ?? Continue good feminine hygiene by wiping from front to back    ?? If you have stitches, you will receive Tucks and Demoplast spray. If using Demoplast, hold the can at least 12 inches away from your stitches to prevent burning. Your nurse will instruct you more in their use    ?? Change your sanitary pad frequently    ?? If hemorrhoids are present, you may apply Tucks    ?? Use of a warm sitz bath is encouraged to speed the healing process of the perineum (20 minutes twice a day)- this may be done using a disposable sitz bath or a tub type as long as the tub is thoroughly scrubbed with cleaning agents and rinsed prior to each use    LOCHIA/VAGINAL BLEEDING:    ?? Appearance can change from red to pink to white or from red to brown. This can last up to six weeks    ?? Call your doctor or midwife if the lochia (bloody discharge) is greenish or has a foul odor    ?? Call your doctor or  midwife if the bleeding increases enough to cause soaking of a sanitary pad in an hour and/or if clots are passed that are larger than a small egg    BREASTFEEDING:    ?? Consult the breastfeeding handouts    ?? Wear a supportive bra with easy access to the breast    ?? Call your doctor or midwife immediately if you develop a hardened area that is read, sore and hot to the touch on your breast    SEX/BIRTH CONTROL:    ?? No sex, douching, or tampons for at least 6 weeks    ?? There are many methods of birth control available. Consult your doctor/midwife    SPECIAL INSTRUCTIONS: Please call   your doctor IMMEDIATELY if you are having trouble with:    ?? Increased vaginal bleeding (more than one pad per hour)    ?? Foul smelling vaginal discharge    ?? Problems with bowel movements or urination    ?? Leg pains or increased swelling    ?? Shortness of breath    ?? Problems with breasts, especially redness, pain or engorgement    ?? Feeling depressed, tearful, unable to cope, having suicidal or homicidal thoughts    ?? Your temperature goes above 100.4    FOLLOW-UP:    ?? Call TODAY for an appointment or as soon as possible if an appointment hasn't already been made for you    ?? If a home health visit is arranged by the discharge coordinators - call if you have questions at [phone]      I understand that if any problems occur once I am at at home I am to contact my physician.    I understand and acknowledge receipt of the instructions indicated above.    I have verified Mother and Infant identification bands match at the time of discharge.              _____________________________________________________________    RN or Physician's signature Date/Time            _____________________________________________________________    Patient or Representative Signature Date/Time        Breast Pumping Tips  If you are breastfeeding, there may be times when you cannot feed your baby directly. Returning to work or going on a trip are  common examples. Pumping allows you to store breast milk and feed it to your baby later.  You may not get much milk when you first start to pump. Your breasts should start to make more after a few days. If you pump at the times you usually feed your baby, you may be able to keep making enough milk to feed your baby without also using formula. The more often you pump, the more milk you will produce.  When should I pump?  ?? You can begin to pump soon after delivery. However, some experts recommend waiting about 4 weeks before giving your infant a bottle to make sure breastfeeding is going well.  ?? If you plan to return to work, begin pumping a few weeks before. This will help you develop techniques that work best for you. It also lets you build up a supply of breast milk.  ?? When you are with your infant, feed on demand and pump after each feeding.  ?? When you are away from your infant for several hours, pump for about 15 minutes every 2-3 hours. Pump both breasts at the same time if you can.  ?? If your infant has a formula feeding, make sure to pump around the same time.  ?? If you drink any alcohol, wait 2 hours before pumping.  How do I prepare to pump?  Your let-down reflex??is the natural reaction to stimulation that makes your breast milk flow. It is easier to stimulate this reflex when you are relaxed. Find relaxation techniques that work for you. If you have difficulty with your let-down reflex, try these methods:  ?? Smell one of your infant's blankets or an item of clothing.  ?? Look at a picture or video of your infant.  ?? Sit in a quiet, private space.  ?? Massage the breast you plan to pump.  ?? Place soothing warmth on  the breast.  ?? Play relaxing music.  What are some general breast pumping tips?  ?? Wash your hands before you pump. You do not need to wash your nipples or breasts.  ?? There are three ways to pump.  ?? You can use your hand to massage and compress your breast.  ?? You can use a handheld manual  pump.  ?? You can use an electric pump.  ?? Make sure the suction cup (flange) on the breast pump is the right size. Place the flange directly over the nipple. If it is the wrong size or placed the wrong way, it may be painful and cause nipple damage.  ?? If pumping is uncomfortable, apply a small amount of purified or modified lanolin to your nipple and areola.  ?? If you are using an electric pump, adjust the speed and suction power to be more comfortable.  ?? If pumping is painful or if you are not getting very much milk, you may need a different type of pump. A lactation consultant can help you determine what type of pump to use.  ?? Keep a full water bottle near you at all times. Drinking lots of fluid helps you make more milk.  ?? You can store your milk to use later. Pumped breast milk can be stored in a sealable, sterile container or plastic bag. Label all stored breast milk with the date you pumped it.  ?? Milk can stay out at room temperature for up to 8 hours.  ?? You can store your milk in the refrigerator for up to 8 days.  ?? You can store your milk in the freezer for 3 months. Thaw frozen milk using warm water. Do not put it in the microwave.  ?? Do not smoke. Smoking can lower your milk supply and harm your infant. If you need help quitting, ask your health care provider to recommend a program.  When should I call my health care provider or a lactation consultant?  ?? You are having trouble pumping.  ?? You are concerned that you are not making enough milk.  ?? You have nipple pain, soreness, or redness.  ?? You want to use birth control. Birth control pills may lower your milk supply. Talk to your health care provider about your options.  This information is not intended to replace advice given to you by your health care provider. Make sure you discuss any questions you have with your health care provider.  Document Released: 05/10/2010 Document Revised: 05/03/2016 Document Reviewed: 09/12/2013  Elsevier  Interactive Patient Education ?? 2017 Elsevier Inc.    Etonogestrel implant  What is this medicine?  ETONOGESTREL (et [place] noe JES trel) is a contraceptive (birth control) device. It is used to prevent pregnancy. It can be used for up to 3 years.  This medicine may be used for other purposes; ask your health care provider or pharmacist if you have questions.  COMMON BRAND NAME(S): Implanon, Nexplanon  What may interact with this medicine?  Do not take this medicine with any of the following medications:  -amprenavir  -bosentan  -fosamprenavir  This medicine may also interact with the following medications:  -barbiturate medicines for inducing sleep or treating seizures  -certain medicines for fungal infections like ketoconazole and itraconazole  -grapefruit juice  -griseofulvin  -medicines to treat seizures like carbamazepine, felbamate, oxcarbazepine, phenytoin, topiramate  -modafinil  -phenylbutazone  -rifampin  -rufinamide  -some medicines to treat HIV infection like atazanavir, indinavir, lopinavir, nelfinavir, tipranavir,  ritonavir  -St. John's wort  This list may not describe all possible interactions. Give your health care provider a list of all the medicines, herbs, non-prescription drugs, or dietary supplements you use. Also tell them if you smoke, drink alcohol, or use illegal drugs. Some items may interact with your medicine.  What should I watch for while using this medicine?  This product does not protect you against HIV infection (AIDS) or other sexually transmitted diseases.  You should be able to feel the implant by pressing your fingertips over the skin where it was inserted. Contact your doctor if you cannot feel the implant, and use a non-hormonal birth control method (such as condoms) until your doctor confirms that the implant is in place. If you feel that the implant may have broken or become bent while in your arm, contact your healthcare provider.  What side effects may I notice from receiving  this medicine?  Side effects that you should report to your doctor or health care professional as soon as possible:  -allergic reactions like skin rash, itching or hives, swelling of the face, lips, or tongue  -breast lumps  -changes in emotions or moods  -depressed mood  -heavy or prolonged menstrual bleeding  -pain, irritation, swelling, or bruising at the insertion site  -scar at site of insertion  -signs of infection at the insertion site such as fever, and skin redness, pain or discharge  -signs of pregnancy  -signs and symptoms of a blood clot such as breathing problems; changes in vision; chest pain; severe, sudden headache; pain, swelling, warmth in the leg; trouble speaking; sudden numbness or weakness of the face, arm or leg  -signs and symptoms of liver injury like dark yellow or brown urine; general ill feeling or flu-like symptoms; light-colored stools; loss of appetite; nausea; right upper belly pain; unusually weak or tired; yellowing of the eyes or skin  -unusual vaginal bleeding, discharge  -signs and symptoms of a stroke like changes in vision; confusion; trouble speaking or understanding; severe headaches; sudden numbness or weakness of the face, arm or leg; trouble walking; dizziness; loss of balance or coordination  Side effects that usually do not require medical attention (report to your doctor or health care professional if they continue or are bothersome):  -acne  -back pain  -breast pain  -changes in weight  -dizziness  -general ill feeling or flu-like symptoms  -headache  -irregular menstrual bleeding  -nausea  -sore throat  -vaginal irritation or inflammation  This list may not describe all possible side effects. Call your doctor for medical advice about side effects. You may report side effects to FDA at 1-800-FDA-1088.  Where should I keep my medicine?  This drug is given in a hospital or clinic and will not be stored at home.  NOTE: This sheet is a summary. It may not cover all  possible information. If you have questions about this medicine, talk to your doctor, pharmacist, or health care provider.  ?? 2018 Elsevier/Gold Standard (2016-06-08 11:19:22)

## 2017-06-16 NOTE — Unmapped (Signed)
Problem: Knowledge Deficit      Goal: Patient/family/caregiver demonstrates understanding of disease process, treatment plan, medications, and discharge instructions  Complete learning assessment and assess knowledge base.   Outcome: Adequate for Discharge Date Met: 06/16/17  Pt. Discharge teaching done    Comments: Pt. Verbalized understanding of home postpartum care instructions and followup with [facility] on 07-23-17.  Pt. Verbalized understanding to call Dr. Rennis Petty with sadness, depression, crying and for any concern including saturating sanitary pad every hour or pass a blood clot egg size or larger and to pick up her discharge medications at her [facility].  Home today when her ride arrives.  Pt.  Is alert and oriented.

## 2017-06-17 NOTE — Unmapped (Signed)
Received call from answering service    Reports delivering on 7/12. Had DMPA shot prior to discharge, is concerned after passing a clot. She reports using 4 pads per day, no heavy bleeding. Denies lightheadedness, dizziness. Discussed coming to hospital if she is using >1 pad per hour for 2 hours in a row or making an appointment this week if she has any concerns.     Joeseph Amor, MD  PGY2 Ob/Gyn

## 2017-06-19 ENCOUNTER — Ambulatory Visit: Payer: PRIVATE HEALTH INSURANCE | Primary: Dental Public Health

## 2018-01-18 ENCOUNTER — Inpatient Hospital Stay
Admit: 2018-01-18 | Discharge: 2018-01-18 | Disposition: A | Discharge status: Home / Self Care | Payer: PRIVATE HEALTH INSURANCE

## 2018-01-18 DIAGNOSIS — M545 Low back pain: Secondary | ICD-10-CM

## 2018-01-18 LAB — URINALYSIS W/RFL MICROSCOP, RFL CULTURE
Bilirubin, UA: NEGATIVE
Blood, UA: NEGATIVE
Glucose, UA: NEGATIVE mg/dL
Hyaline Casts, UA: 1 /LPF (ref 0–2)
Ketones, UA: 5 mg/dL
Nitrite, UA: NEGATIVE
Protein, UA: 100 mg/dL
RBC, UA: 2 /HPF (ref 0–3)
Specific Gravity, UA: 1.026 (ref 1.005–1.035)
Squam Epithel, UA: 42 /HPF (ref 0–5)
Urobilinogen, UA: 2 mg/dL (ref 0.2–1.9)
WBC, UA: 6 /HPF (ref 0–5)
pH, UA: 6 (ref 5.0–8.0)

## 2018-01-18 LAB — STREP A SCRN, DNA PROBE IF NEG: Rapid Strep A Screen: NEGATIVE

## 2018-01-18 LAB — URINE CULTURE

## 2018-01-18 LAB — STREP A DNA - AMPLIFIED: Strep A DNA - Amplified: NEGATIVE

## 2018-01-18 MED ORDER — ibuprofen (ADVIL,MOTRIN) tablet 400 mg
400 | Freq: Once | ORAL | Status: AC
Start: 2018-01-18 — End: 2018-01-18
  Administered 2018-01-18: 21:00:00 400 mg via ORAL

## 2018-01-18 MED ORDER — lidocaine (LIDODERM) 5 % 1 patch
5 | TOPICAL | Status: AC
Start: 2018-01-18 — End: 2018-01-18
  Administered 2018-01-18: 21:00:00 1 via TRANSDERMAL

## 2018-01-18 MED ORDER — ibuprofen (ADVIL,MOTRIN) 400 MG tablet
400 | ORAL_TABLET | Freq: Four times a day (QID) | ORAL | 0 refills | Status: AC | PRN
Start: 2018-01-18 — End: ?

## 2018-01-18 MED FILL — LIDODERM 5 % TOPICAL PATCH: 5 5 % | TOPICAL | Qty: 1

## 2018-01-18 MED FILL — IBUPROFEN 400 MG TABLET: 400 400 MG | ORAL | Qty: 1

## 2018-01-18 NOTE — Unmapped (Signed)
You were seen in the emergency department for back pain. This is most likely muscle pain.     Please follow-up with your primary care provider in 1 week  Remain active as tolerated     GET HELP RIGHT AWAY IF:   The pain spreads into your legs.   You cannot control when you poop (bowel movement) or pee (urinate).   Your arms or legs feel weak or lose feeling (numbness).   You feel sick to your stomach (nauseous) or throw up (vomit).   You have belly (abdominal) pain.   You feel like you may pass out (faint).  Or you have any other concerns    Back Exercises   Back exercises help treat and prevent back injuries. The goal of back exercises is to increase the strength of your abdominal and back muscles and the flexibility of your back. These exercises should be started when you no longer have back pain. Back exercises include:   Pelvic Tilt. Lie on your back with your knees bent. Tilt your pelvis until the lower part of your back is against the floor. Hold this position 5 to 10 sec and repeat 5 to 10 times.   Knee to Chest. Pull first 1 knee up against your chest and hold for 20 to 30 seconds, repeat this with the other knee, and then both knees. This may be done with the other leg straight or bent, whichever feels better.   Sit-Ups or Curl-Ups. Bend your knees 90 degrees. Start with tilting your pelvis, and do a partial, slow sit-up, lifting your trunk only 30 to 45 degrees off the floor. Take at least 2 to 3 seconds for each sit-up. Do not do sit-ups with your knees out straight. If partial sit-ups are difficult, simply do the above but with only tightening your abdominal muscles and holding it as directed.   Hip-Lift. Lie on your back with your knees flexed 90 degrees. Push down with your feet and shoulders as you raise your hips a couple inches off the floor; hold for 10 seconds, repeat 5 to 10 times.   Back arches. Lie on your stomach, propping yourself up on bent elbows. Slowly press on your hands, causing an arch  in your low back. Repeat 3 to 5 times. Any initial stiffness and discomfort should lessen with repetition over time.   Shoulder-Lifts. Lie face down with arms beside your body. Keep hips and torso pressed to floor as you slowly lift your head and shoulders off the floor.  Do not overdo your exercises, especially in the beginning. Exercises may cause you some mild back discomfort which lasts for a few minutes; however, if the pain is more severe, or lasts for more than 15 minutes, do not continue exercises until you see your caregiver. Improvement with exercise therapy for back problems is slow.   See your caregivers for assistance with developing a proper back exercise program.   Document Released: 12/28/2004 Document Revised: 02/12/2012 Document Reviewed: 09/21/2011   ExitCare?? Patient Information ??2014 [employer]

## 2018-01-18 NOTE — Unmapped (Signed)
Pt presents with cyst to her lower mid back x3 days.

## 2018-01-18 NOTE — Unmapped (Signed)
ED Attending Attestation Note    Date of service:  01/18/2018    This patient was seen by the resident physician.  I have seen and examined the patient, agree with the workup, evaluation, management and diagnosis. The care plan has been discussed and I concur.     My assessment reveals a 22 y.o. female who presents to the ED with back pain. On examination, she is alert and oriented X 4. she has clear lungs bilaterally. She has tenderness to palpation in left paraspinal muscle region, lower lumbar region. No lower extremity weakness, gait is normal.

## 2018-01-18 NOTE — Unmapped (Signed)
[facility] ED Note    Date of Service:  01/18/2018    Reason for Visit: Cyst      Patient History     HPI:  Christina Mckay is a 22 y.o. female  who presents today with a 3 day history of Back pain.  Patient states that she noticed that about 3 days ago she noticed some left lower back pain that she thought was a cyst and she felt some swelling in that area.  She describes it as severe, worse with movement.  Has not had any rash over this area.  No lower extremity weakness or numbness.  Has never had symptoms like this before.  No urinary incontinence or bowel incontinence.  Has not had any fevers.  She denies IV drug use.  States she has been having some urinary frequency, but denies any dysuria with this.    She also reports sore throat for one week.  Has had a mild cough, but no runny nose.  Denies any fevers.    History reviewed. No pertinent past medical history.    Past Surgical History:   Procedure Laterality Date   ??? CARDIAC SURGERY     ??? OVARIAN CYST SURGERY         Mickel Baas  reports that she has quit smoking. Her smoking use included Cigars. She smoked 1.00 pack per day. She has never used smokeless tobacco. She reports that she uses drugs, including Marijuana. She reports that she does not drink alcohol.    Previous Medications    ACETAMINOPHEN (TYLENOL) 325 MG TABLET    Take 2 tablets (650 mg total) by mouth every 6 hours as needed.    CYCLOBENZAPRINE (FLEXERIL) 5 MG TABLET    Take 1 tablet (5 mg total) by mouth 3 times a day as needed for Muscle spasms.    DOCUSATE SODIUM (COLACE) 100 MG CAPSULE    Take 1 capsule (100 mg total) by mouth 2 times a day as needed for Constipation.    IBUPROFEN (ADVIL,MOTRIN) 200 MG TABLET    Take 3 tablets (600 mg total) by mouth every 6 hours as needed.    [id number]-DHA-EPA-FISH OIL 400 MCG-35 MG -25 MG-5 MG CHEW    Chew by mouth.    POLYETHYLENE GLYCOL (GLYCOLAX) 17 GRAM/DOSE POWDER    Take 17 g by mouth 2 times a  day as needed (constipation).       Allergies:   Allergies as of 01/18/2018 - Fully Reviewed 01/18/2018   Allergen Reaction Noted   ??? Amoxicillin Swelling 07/11/2016       PMH: Nursing notes reviewed   PSH: Nursing notes reviewed   FH: Nursing notes reviewed   MEDS: Nursing notes and chart reviewed       Review of Systems     ROS:   Review of Systems   Constitutional: Negative for chills and fever.   HENT: Positive for sore throat.    Eyes: Negative for blurred vision and double vision.   Respiratory: Negative for cough and stridor.    Cardiovascular: Negative for chest pain and leg swelling.   Gastrointestinal: Negative for abdominal pain, nausea and vomiting.   Genitourinary: Negative for dysuria and urgency.   Musculoskeletal: Positive for back pain. Negative for joint pain.   Skin: Negative for rash.   Neurological: Negative for dizziness, speech change, focal weakness and headaches.   All other systems reviewed and are negative.        Physical Exam  ED Triage Vitals [01/18/18 1418]   Vital Signs Group      Temp 98.4 ??F (36.9 ??C)      Temp Source Oral      Heart Rate 99      Heart Rate Source       Resp 16      SpO2 97 %      BP 97/71      MAP (mmHg) 77      BP Location Right arm      BP Method Automatic      Patient Position Sitting   SpO2 97 %   O2 Device None (Room air)       General:  Awake, Alert, Nontoxic     HEENT:  Atraumatic. PERRL. Oropharynx unremarkable.    Neck:  Trachea midline. Full range of motion    Pulmonary:   Lungs clear to auscultation bilaterally. Breathing unlabored.    Abdomen:  Soft, nondistended, nontender. No masses. No CVA tenderness    Musculoskeletal:  Area of tenderness to the left lower paraspinous muscles.  No midline tenderness to palpation.    Vascular:  Pulses palpable and symmetric in all extremities. Good distal perfusion.    Skin:  No rashes, lacerations or discoloration.    Neuro:  Awake & Alert. No focal motor or sensory deficits appreciated.    Psych:  Normal speech  and affect.     Diagnostic Studies     Labs:  Please see electronic medical record for any tests performed in the ED     Radiology:  No tests were performed during this ED visit    EKG:  No EKG Performed    Emergency Department Procedures     Procedures      ED Course and MDM     The patient was evaluated by myself and the ED Attending Physician, Dr. Thelma Barge. All management and disposition plans were discussed and agreed upon.    Based on our evaluation the patient's presentation is most consistent with musculoskeletal back pain.  I did [place] her back that showed no evidence of soft tissue infection. There is no signs of cauda equina syndrome or cord compression on our evaluation.  We have a very low suspicion of epidural abscess/hematoma or other signs of vascular emergency contributing to the patient's pain given the patient's history.  There are no signs on exam of acute neurologic compromise.  There is no history of recent trauma that would make [place] concerned for acute fracture.  We have counseled the patient on appropriate stretching and strengthening exercises to help control symptoms and encouraged follow-up as an outpatient after strict return precautions were provided.    Risks, benefits, and alternatives were discussed. At this time the patient has been deemed safe for discharge. My customary discharge instructions including strict return precautions for worsening or new symptoms have been communicated.      Summary of Treatment in ED:  Medications   lidocaine (LIDODERM) 5 % 1 patch (1 patch Transdermal Patch Applied 01/18/18 1629)   ibuprofen (ADVIL,MOTRIN) tablet 400 mg (400 mg Oral Given 01/18/18 1629)       Impression     1.   1. Bilateral low back pain without sciatica, unspecified chronicity         Plan   1. The patient is to be discharged home in stable/improved condition.  2. Workup, treatment and diagnosis were discussed with the patient and/or family members; the patient agrees to the plan and  all  questions were addressed and answered.  3.The patient is instructed to return to the emergency department should her symptoms worsen or any concern she believes warrants acute physician evaluation.    San Morelle, MD, PGY-3  [facility]         San Morelle, MD  Resident  01/20/18 [phone]

## 2018-01-18 NOTE — Unmapped (Signed)
EIP completed Rapid HIV test- negative results.

## 2018-08-12 NOTE — ED Triage Notes (Signed)
Pt states she was eating what she thought was boneless wings when she swallowed a piece that had a bone and the bone became stuck in her throat. No difficulty breathing but does state it is uncomfortable.

## 2018-08-13 ENCOUNTER — Inpatient Hospital Stay: Admit: 2018-08-13 | Discharge: 2018-08-13 | Discharge status: Against Medical Advice | Payer: PRIVATE HEALTH INSURANCE

## 2018-08-21 ENCOUNTER — Inpatient Hospital Stay
Admit: 2018-08-21 | Discharge: 2018-08-21 | Disposition: A | Discharge status: Home / Self Care | Payer: PRIVATE HEALTH INSURANCE

## 2018-08-21 DIAGNOSIS — J020 Streptococcal pharyngitis: Secondary | ICD-10-CM

## 2018-08-21 LAB — STREP A SCRN, DNA PROBE IF NEG: Rapid Strep A Screen: POSITIVE

## 2018-08-21 MED ORDER — clindamycin (CLEOCIN) 300 MG capsule
300 | ORAL_CAPSULE | Freq: Three times a day (TID) | ORAL | 0 refills | Status: AC
Start: 2018-08-21 — End: 2018-08-31

## 2018-08-21 MED ORDER — dexamethasone (PF) (DECADRON) injection Soln 10 mg
10 | Freq: Once | INTRAMUSCULAR | Status: AC
Start: 2018-08-21 — End: 2018-08-21
  Administered 2018-08-21: 17:00:00 10 mg via INTRAMUSCULAR

## 2018-08-21 MED FILL — DEXAMETHASONE SODIUM PHOSPHATE (PF) 10 MG/ML INJECTION SOLUTION: 10 10 mg/mL | INTRAMUSCULAR | Qty: 1

## 2018-08-21 NOTE — Unmapped (Signed)
Strep test was positive today   Take antibiotics as prescribed for the entire course   Follow up with PCP    Please return to the ER if your symptoms do not improve, your symptoms worsen, or for any other concerning sign or symptom.

## 2018-08-21 NOTE — ED Triage Notes (Signed)
Pt states she has had sore throat and ear pain for 4 days.

## 2018-08-21 NOTE — Unmapped (Signed)
[facility] ED Note    Date of Service: 08/21/2018    Reason for Visit: Otalgia and Sore Throat      Patient History     HPI:  This is a 22 y.o. female with PMH as noted below presenting with sore throat and ear pain.  Symptoms have been present for the last 4 days, starting with sore throat, then she developed a cough that is occasionally productive of clear sputum and subsequently right ear pain.  She has had no associated fevers or chills.  She has been swallowing solids and liquids despite pain without difficulty.  No known recent ill contacts, no facial swelling, no significant nasal congestion or ear drainage.     With the exception of the above, there are no aggravating or alleviating factors.    History reviewed. No pertinent past medical history.    Past Surgical History:   Procedure Laterality Date   ??? CARDIAC SURGERY     ??? OVARIAN CYST SURGERY         Mickel Baas  reports that she has quit smoking. Her smoking use included Cigars. She smoked 1.00 pack per day. She has never used smokeless tobacco. She reports that she uses drugs, including Marijuana. She reports that she does not drink alcohol.    Discharge Medication List as of 08/21/2018  1:24 PM      START taking these medications    Details   clindamycin (CLEOCIN) 300 MG capsule Take 1 capsule (300 mg total) by mouth 3 times a day for 10 days., Starting Wed 08/21/2018, Until Sat 08/31/2018, Print, Disp-30 capsule, R-0         CONTINUE these medications which have NOT CHANGED    Details   acetaminophen (TYLENOL) 325 MG tablet Take 2 tablets (650 mg total) by mouth every 6 hours as needed., Starting Wed 04/11/2017, Print      cyclobenzaprine (FLEXERIL) 5 MG tablet Take 1 tablet (5 mg total) by mouth 3 times a day as needed for Muscle spasms., Starting Wed 04/11/2017, Print      docusate sodium (COLACE) 100 MG capsule Take 1 capsule (100 mg total) by mouth 2 times a day as needed for Constipation., Starting  Fri 06/15/2017, Normal      ibuprofen (ADVIL,MOTRIN) 400 MG tablet Take 1 tablet (400 mg total) by mouth every 6 hours as needed for Pain., Starting Fri 01/18/2018, Print, Disp-30 tablet, R-0      [id number]-dha-epa-fish oil 400 mcg-35 mg -25 mg-5 mg Chew Chew by mouth., Historical Med      polyethylene glycol (GLYCOLAX) 17 gram/dose powder Take 17 g by mouth 2 times a day as needed (constipation)., Starting Fri 06/15/2017, Normal             Allergies:   Allergies as of 08/21/2018 - Fully Reviewed 08/21/2018   Allergen Reaction Noted   ??? Amoxicillin Swelling 07/11/2016       PMH: Nursing notes reviewed   PSH: Nursing notes reviewed   FH: Nursing notes reviewed   MEDS: Nursing notes and chart reviewed         Review of Systems     ROS:  All other systems reviewed and are negative    Physical Exam     Vitals:    08/21/18 1023   BP: 103/65   BP Location: Left arm   Patient Position: Sitting   Pulse: 108   Resp: 16   Temp: 98.2 ??F (36.8 ??C)   TempSrc: Oral  SpO2: 98%   Weight: (!) 88 lb (39.9 kg)       General:  well nourished; well developed; in no apparent distress     HEENT:  normocephalic, atraumatic; pupils equal, round and reactive; sclera anicteric; conjunctiva pink; moist mucous membranes  2+ erythematous tonsils bilaterally with exudate on the left tonsil, uvula is midline, maintaining secretions appropriately, no rhinorrhea, external auditory canals patent bilaterally, normal tympanic membrane on the left, slightly retracted TM on the right with no layering of serous fluid, no erythematous TM    Neck:  no meningismus; trachea midline  And her cervical lymphadenopathy is present, mildly tender    Pulmonary:   lung sounds clear to auscultation bilaterally with good air entry; no respiratory distress; no wheezes or rales     Cardiac:  regular rate and rhythm with no murmurs, rubs, or gallops     Vascular:  2+ peripheral pulses in bilateral upper extremities     Skin:  warm and well perfused without rashes or  lesions     Psych:  appropriate mood and affect       Diagnostic Studies     Labs: The attending and I have reviewed laboratory findings.  Labs were reviewed and and significant values identified.    Please see electronic medical record for any tests performed in the ED     Labs Reviewed - No data to display    IMAGING STUDIES / RADIOLOGY: The attending and I have reviewed radiographic imaging.  Imaging studies were reviewed and and significant values identified.    Please see electronic medical record for any tests performed in the ED    No orders to display     Emergency Department Procedures   None    ED Course and MDM     Vital signs, medical history, social history, allergies and nursing notes reviewed.    Vitals:  BP 103/65 (BP Location: Left arm, Patient Position: Sitting)    Pulse 108    Temp 98.2 ??F (36.8 ??C) (Oral)    Resp 16    Wt (!) 88 lb (39.9 kg)    SpO2 98%    BMI 16.63 kg/m??     Briefly this is a is a 22 y.o. female who presents to the emergency department with sore throat, cough and right ear pain.  Patient presented afebrile with normal vitals.  Patient was in no acute distress, nontoxic and non-hypoxic.     On exam she had enlarged, erythematous and exudative tonsils with no evidence of peritonsillar abscess, uvula is midline, airways patent maintaining secretions appropriately. Strep test was positive. She was given 10 mg of Decadron, and with allergy to penicillin she was given a prescription for clindamycin to take for the next 10 days to take on an outpatient basis to treat for strep pharyngitis. She had a slightly retracted right TM with no evidence of otitis media or otitis externa. Also no evidence of sinusitis.   After results of strep test, patient was requesting discharge. We were in agreement with this plan. She understands that she may return at any time for further concerns or worsening symptoms.    The patient was seen and evaluated indendenty.  The patient and / or the family were  informed of the results of any tests, a time was given to answer questions, a plan was proposed and they agreed with plan.     Consults:  None    Medications administered in the ED:  Medications   dexamethasone (PF) (DECADRON) injection Soln 10 mg (10 mg Intramuscular Given 08/21/18 1307)       Impression     1. Strep pharyngitis         Plan   1. The patient is to be discharged home in stable/improved condition.  2. Workup, treatment and diagnosis were discussed with the patient and/or family members; the patient agrees to the plan and all questions were addressed and answered.  3. The patient is instructed to return to the emergency department should her symptoms worsen or any concern she believes warrants acute physician evaluation.  4. The patient will be discharged on the following medications, which were discussed with the patient:   - Clindamycin             Shepard General, PA  08/22/18 1006

## 2019-05-20 ENCOUNTER — Encounter (HOSPITAL_COMMUNITY): Payer: Self-pay

## 2019-05-20 ENCOUNTER — Emergency Department (HOSPITAL_COMMUNITY)
Admission: EM | Admit: 2019-05-20 | Discharge: 2019-05-21 | Disposition: A | Discharge status: Home / Self Care | Payer: Medicaid - Out of State | Attending: Emergency Medicine | Admitting: Emergency Medicine

## 2019-05-20 ENCOUNTER — Other Ambulatory Visit: Payer: Self-pay

## 2019-05-20 DIAGNOSIS — M545 Low back pain, unspecified: Secondary | ICD-10-CM

## 2019-05-20 DIAGNOSIS — F17290 Nicotine dependence, other tobacco product, uncomplicated: Secondary | ICD-10-CM | POA: Insufficient documentation

## 2019-05-20 DIAGNOSIS — J45909 Unspecified asthma, uncomplicated: Secondary | ICD-10-CM | POA: Diagnosis not present

## 2019-05-20 DIAGNOSIS — R102 Pelvic and perineal pain: Secondary | ICD-10-CM | POA: Diagnosis not present

## 2019-05-20 LAB — CBC
HCT: 39.8 % (ref 36.0–46.0)
Hemoglobin: 13.2 g/dL (ref 12.0–15.0)
MCH: 29.9 pg (ref 26.0–34.0)
MCHC: 33.2 g/dL (ref 30.0–36.0)
MCV: 90 fL (ref 80.0–100.0)
Platelets: 304 10*3/uL (ref 150–400)
RBC: 4.42 MIL/uL (ref 3.87–5.11)
RDW: 10.7 % — ABNORMAL LOW (ref 11.5–15.5)
WBC: 10.2 10*3/uL (ref 4.0–10.5)
nRBC: 0 % (ref 0.0–0.2)

## 2019-05-20 LAB — I-STAT BETA HCG BLOOD, ED (MC, WL, AP ONLY): I-stat hCG, quantitative: <5 m[IU]/mL (ref <5)

## 2019-05-20 LAB — URINALYSIS, ROUTINE W REFLEX MICROSCOPIC
Bilirubin Urine: NEGATIVE
Glucose, UA: NEGATIVE mg/dL
Hgb urine dipstick: NEGATIVE
Ketones, ur: 5 mg/dL — AB
Nitrite: NEGATIVE
Protein, ur: NEGATIVE mg/dL
Specific Gravity, Urine: 1.023 (ref 1.005–1.030)
pH: 6 (ref 5.0–8.0)

## 2019-05-20 LAB — COMPREHENSIVE METABOLIC PANEL
ALT: 13 U/L (ref 0–44)
AST: 17 U/L (ref 15–41)
Albumin: 3.7 g/dL (ref 3.5–5.0)
Alkaline Phosphatase: 19 U/L — ABNORMAL LOW (ref 38–126)
Anion gap: 7 (ref 5–15)
BUN: 11 mg/dL (ref 6–20)
CO2: 27 mmol/L (ref 22–32)
Calcium: 9.6 mg/dL (ref 8.9–10.3)
Chloride: 103 mmol/L (ref 98–111)
Creatinine, Ser: 1.25 mg/dL — ABNORMAL HIGH (ref 0.44–1.00)
GFR calc Af Amer: >60 mL/min (ref >60)
GFR calc non Af Amer: >60 mL/min (ref >60)
Glucose, Bld: 85 mg/dL (ref 70–99)
Potassium: 3.3 mmol/L — ABNORMAL LOW (ref 3.5–5.1)
Sodium: 137 mmol/L (ref 135–145)
Total Bilirubin: 0.7 mg/dL (ref 0.3–1.2)
Total Protein: 7.3 g/dL (ref 6.5–8.1)

## 2019-05-20 LAB — LIPASE, BLOOD: Lipase: 46 U/L (ref 11–51)

## 2019-05-20 MED ORDER — SODIUM CHLORIDE 0.9% FLUSH: 3.0000 mL | Freq: Once | INTRAVENOUS | Status: DC

## 2019-05-20 NOTE — ED Triage Notes (Signed)
Onset 1 week right sided abd pain.  Pt reports h/o "cysts" that she had surgery for.  Not sure of details.  No other s/s noted.

## 2019-05-20 NOTE — ED Provider Notes (Signed)
TIME SEEN: 11:44 PM  CHIEF COMPLAINT: Right-sided abdominal pain  HPI: Patient is a 23 year old female with history of asthma, ovarian cyst status post cystectomy who presents to the emergency department with right-sided flank and lower abdominal pain for the past week.  States this feels similar to her previous cyst.  Pain is sharp and worse with movement, twisting and turning.  No injury to her back.  No numbness or weakness.  No fevers, chills, nausea, vomiting, diarrhea, vaginal bleeding, discharge.  Last menstrual period was the beginning of May.  She has had previous STDs which have been treated.  ROS: See HPI Constitutional: no fever  Eyes: no drainage  ENT: no runny nose   Cardiovascular:  no chest pain  Resp: no SOB  GI: no vomiting GU: no dysuria Integumentary: no rash  Allergy: no hives  Musculoskeletal: no leg swelling  Neurological: no slurred speech ROS otherwise negative  PAST MEDICAL HISTORY/PAST SURGICAL HISTORY:  Past Medical History:  Diagnosis Date  . Asthma     MEDICATIONS:  Prior to Admission medications   Medication Sig Start Date End Date Taking? Authorizing Provider  acetaminophen (TYLENOL) 500 MG tablet Take 1,000 mg by mouth every 6 (six) hours as needed for pain.    [provider]  albuterol (PROVENTIL HFA;VENTOLIN HFA) 108 (90 BASE) MCG/ACT inhaler Inhale 2 puffs into the lungs every 6 (six) hours as needed for wheezing.    [provider]    ALLERGIES:  Allergies  Allergen Reactions  . Amoxicillin Hives    SOCIAL HISTORY:  Social History   Tobacco Use  . Smoking status: Current Every Day Smoker    Packs/day: 2.00    Types: Cigars  Substance Use Topics  . Alcohol use: No    FAMILY HISTORY: History reviewed. No pertinent family history.  EXAM: BP 105/66 (BP Location: Right Arm)   Pulse 78   Temp 98.2 F (36.8 C) (Oral)   Resp 16   LMP 04/04/2019   SpO2 100%  CONSTITUTIONAL: Alert and oriented and responds  appropriately to questions. Well-appearing; well-nourished HEAD: Normocephalic EYES: Conjunctivae clear, pupils appear equal, EOMI ENT: normal nose; moist mucous membranes NECK: Supple, no meningismus, no nuchal rigidity, no LAD  CARD: RRR; S1 and S2 appreciated; no murmurs, no clicks, no rubs, no gallops RESP: Normal chest excursion without splinting or tachypnea; breath sounds clear and equal bilaterally; no wheezes, no rhonchi, no rales, no hypoxia or respiratory distress, speaking full sentences ABD/GI: Normal bowel sounds; non-distended; soft, ender in the right pelvic region, no rebound, no guarding, no peritoneal signs, no hepatosplenomegaly GU:  Normal external genitalia. No lesions, rashes noted. Patient has no vaginal bleeding on exam.  Small amount of thin gray/yellow appearing vaginal discharge.  No adnexal tenderness, mass or fullness, no cervical motion tenderness. Cervix is not appear friable.  Cervix is closed.  Chaperone present for exam. BACK:  The back appears normal and is tender over the right lower back without erythema, warmth, ecchymosis or swelling.  No lesions.  No midline spinal tenderness. EXT: Normal ROM in all joints; non-tender to palpation; no edema; normal capillary refill; no cyanosis, no calf tenderness or swelling    SKIN: Normal color for age and race; warm; no rash NEURO: Moves all extremities equally, no numbness, no saddle anesthesia PSYCH: The patient's mood and manner are appropriate. Grooming and personal hygiene are appropriate.  MEDICAL DECISION MAKING: Patient here with right lower abdominal pain.  Doubt appendicitis given prolonged symptoms.  May  be ovarian cyst, TOA, PID.  She is very well-appearing here.  Low suspicion for torsion.  Kidney stone also on the differential but patient appears very comfortable and her urine shows no hematuria.  Urine does show moderate leukocyte esterase with 11-20 white blood cells but rare bacteria and many squamous  cells.  Suspect dirty catch.  Will obtain transvaginal ultrasound with Doppler and perform pelvic exam with cultures.  Will give pain medication here in the emergency department.  Discussed with patient that this may also be musculoskeletal nature given it is worse with movement.  No focal neurologic deficits today.  Doubt cauda equina, epidural abscess or hematoma, discitis or osteomyelitis, transverse myelitis, fracture.  ED PROGRESS: Patient's ultrasound shows multiple bilateral ovarian follicles with no torsion, cyst.  IUD noted in the endometrial canal.  Wet prep is positive for clue cells but she had no significant discharge.  I do not think this needs to be treated.  She has no itching, burning, inflammation of her genitalia.  No cervical motion tenderness to suggest pelvic inflammatory disease.  I suspect that this is musculoskeletal in nature.  Will discharge with ibuprofen, Flexeril.  I feel she is safe for discharge home.   At this time, I do not feel there is any life-threatening condition present. I have reviewed and discussed all results (EKG, imaging, lab, urine as appropriate) and exam findings with patient/family. I have reviewed nursing notes and appropriate previous records.  I feel the patient is safe to be discharged home without further emergent workup and can continue workup as an outpatient as needed. Discussed usual and customary return precautions. Patient/family verbalize understanding and are comfortable with this plan.  Outpatient follow-up has been provided as needed. All questions have been answered.      Iam Lipson, Layla MawKristen N, DO 05/21/19 [phone]

## 2019-05-21 ENCOUNTER — Emergency Department (HOSPITAL_COMMUNITY): Payer: Medicaid - Out of State

## 2019-05-21 LAB — WET PREP, GENITAL
Sperm: NONE SEEN
Trich, Wet Prep: NONE SEEN
Yeast Wet Prep HPF POC: NONE SEEN

## 2019-05-21 LAB — GC/CHLAMYDIA PROBE AMP (~~LOC~~) NOT AT ARMC
Chlamydia: POSITIVE — AB
Neisseria Gonorrhea: POSITIVE — AB

## 2019-05-21 MED ORDER — CYCLOBENZAPRINE HCL 5 MG PO TABS: 5.0000 mg | ORAL_TABLET | Freq: Three times a day (TID) | ORAL | 0 refills | Status: AC | PRN

## 2019-05-21 MED ORDER — ONDANSETRON 4 MG PO TBDP
4.0000 mg | ORAL_TABLET | Freq: Once | ORAL | Status: AC
  Administered 2019-05-21: 01:00:00 4 mg via ORAL
  Filled 2019-05-21: qty 1

## 2019-05-21 MED ORDER — IBUPROFEN 800 MG PO TABS: 800.0000 mg | ORAL_TABLET | Freq: Three times a day (TID) | ORAL | 0 refills | Status: AC | PRN

## 2019-05-21 MED ORDER — HYDROCODONE-ACETAMINOPHEN 5-325 MG PO TABS
1.0000 | ORAL_TABLET | Freq: Once | ORAL | Status: AC
  Administered 2019-05-21: 1 via ORAL
  Filled 2019-05-21: qty 1

## 2019-05-21 MED ORDER — IBUPROFEN 800 MG PO TABS
800.0000 mg | ORAL_TABLET | Freq: Once | ORAL | Status: AC
  Administered 2019-05-21: 01:00:00 800 mg via ORAL
  Filled 2019-05-21: qty 1

## 2019-05-21 NOTE — Discharge Instructions (Signed)
You may alternate Tylenol 1000 mg every 6 hours as needed for pain and Ibuprofen 800 mg every 8 hours as needed for pain.  Please take Ibuprofen with food. ° °Steps to find a Primary Care Provider (PCP): ° °Call [phone] or [phone] to access "[place] Find a Doctor Service." ° °2.  You may also go on the [facility] website at [email or web address] ° °3.  [facility] also frequently accepts new patients. ° ° [facility]  °[address] °[place] [address] °[phone] ° °4.  There are also multiple [facility], [facility] and [facility] practices throughout the [place] that are frequently accepting new patients. You may find a clinic that is close to your home and contact them. ° °[facility] °[email or web address] °[phone] ° °[facility] °[email or web address].com ° °[facility] °[email or web address] °[phone] ° °[facility] °[email or web address] °[phone] ° °5.  Local Health Departments also can provide primary care services. ° °[facility]  °[address] °[place] [address] [phone] ° °[facility] °[address] °[place] [address] °[phone] ° °[facility] °[address]  °[place] [address] °[phone] ° ° °

## 2019-05-21 NOTE — ED Notes (Signed)
Patient transported to Ultrasound 

## 2019-06-05 ENCOUNTER — Emergency Department (HOSPITAL_COMMUNITY): Payer: Medicaid - Out of State

## 2019-06-05 ENCOUNTER — Encounter (HOSPITAL_COMMUNITY): Payer: Self-pay | Admitting: *Deleted

## 2019-06-05 ENCOUNTER — Other Ambulatory Visit: Payer: Self-pay

## 2019-06-05 ENCOUNTER — Emergency Department (HOSPITAL_COMMUNITY)
Admission: EM | Admit: 2019-06-05 | Discharge: 2019-06-05 | Disposition: A | Discharge status: Home / Self Care | Payer: Medicaid - Out of State | Attending: Emergency Medicine | Admitting: Emergency Medicine

## 2019-06-05 DIAGNOSIS — Z23 Encounter for immunization: Secondary | ICD-10-CM | POA: Insufficient documentation

## 2019-06-05 DIAGNOSIS — T07XXXA Unspecified multiple injuries, initial encounter: Secondary | ICD-10-CM

## 2019-06-05 DIAGNOSIS — Y929 Unspecified place or not applicable: Secondary | ICD-10-CM | POA: Diagnosis not present

## 2019-06-05 DIAGNOSIS — F17290 Nicotine dependence, other tobacco product, uncomplicated: Secondary | ICD-10-CM | POA: Diagnosis not present

## 2019-06-05 DIAGNOSIS — Y999 Unspecified external cause status: Secondary | ICD-10-CM | POA: Diagnosis not present

## 2019-06-05 DIAGNOSIS — S060X0A Concussion without loss of consciousness, initial encounter: Secondary | ICD-10-CM

## 2019-06-05 DIAGNOSIS — S0083XA Contusion of other part of head, initial encounter: Secondary | ICD-10-CM | POA: Diagnosis not present

## 2019-06-05 DIAGNOSIS — Y939 Activity, unspecified: Secondary | ICD-10-CM | POA: Diagnosis not present

## 2019-06-05 DIAGNOSIS — J45909 Unspecified asthma, uncomplicated: Secondary | ICD-10-CM | POA: Diagnosis not present

## 2019-06-05 DIAGNOSIS — S0990XA Unspecified injury of head, initial encounter: Secondary | ICD-10-CM | POA: Diagnosis present

## 2019-06-05 DIAGNOSIS — W503XXA Accidental bite by another person, initial encounter: Secondary | ICD-10-CM | POA: Diagnosis not present

## 2019-06-05 LAB — I-STAT BETA HCG BLOOD, ED (MC, WL, AP ONLY): I-stat hCG, quantitative: <5 m[IU]/mL (ref <5)

## 2019-06-05 MED ORDER — TETANUS-DIPHTH-ACELL PERTUSSIS 5-2.5-18.5 LF-MCG/0.5 IM SUSP
0.5000 mL | Freq: Once | INTRAMUSCULAR | Status: AC
  Administered 2019-06-05: 07:00:00 0.5 mL via INTRAMUSCULAR
  Filled 2019-06-05: qty 0.5

## 2019-06-05 MED ORDER — DOXYCYCLINE HYCLATE 100 MG PO CAPS: 100.0000 mg | ORAL_CAPSULE | Freq: Two times a day (BID) | ORAL | 0 refills | Status: AC
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
## 2019-06-05 NOTE — ED Notes (Signed)
Some bleeding from the abrasion on her forehead  No bleeding from her chest her rt thumb or her rt shoulder

## 2019-06-05 NOTE — ED Notes (Signed)
Patient transported to X-ray 
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
## 2020-06-21 IMAGING — US ARTERIAL AND VENOUS ULTRASOUND OF THE ABDOMEN PELVIS AND SCROTUM
1 series · 13 of 25 positions shown · non-contrast
Comparison: None.

CLINICAL DATA: Right adnexal pain

EXAM:
TRANSABDOMINAL AND TRANSVAGINAL ULTRASOUND OF PELVIS
DOPPLER ULTRASOUND OF OVARIES
TECHNIQUE: Both transabdominal and transvaginal ultrasound examinations of the
pelvis were performed. Transabdominal technique was performed for
global imaging of the pelvis including uterus, ovaries, adnexal
regions, and pelvic cul-de-sac.
It was necessary to proceed with endovaginal exam following the
transabdominal exam to visualize the uterus, endometrium, ovaries
and adnexa. Color and duplex Doppler ultrasound was utilized to
evaluate blood flow to the ovaries.

[Series 1: arterial and venous ultrasound of the abdomen pelv · 88 acquisitions, 13 frames shown]
[im 1/88]
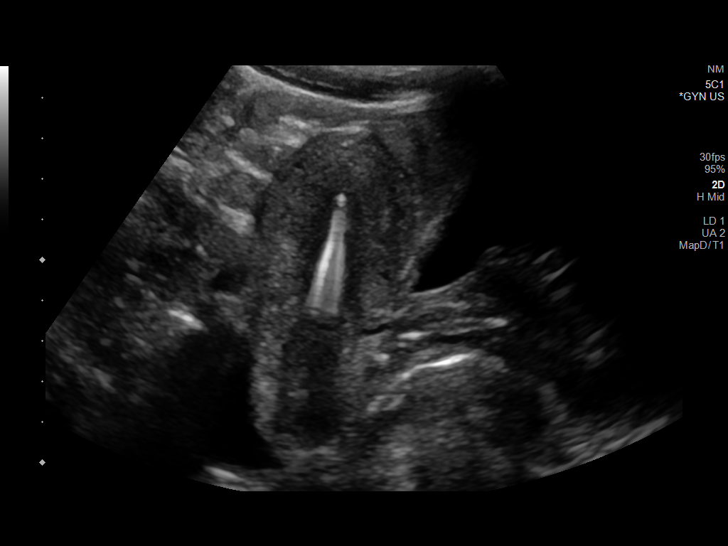
[im 8/88]
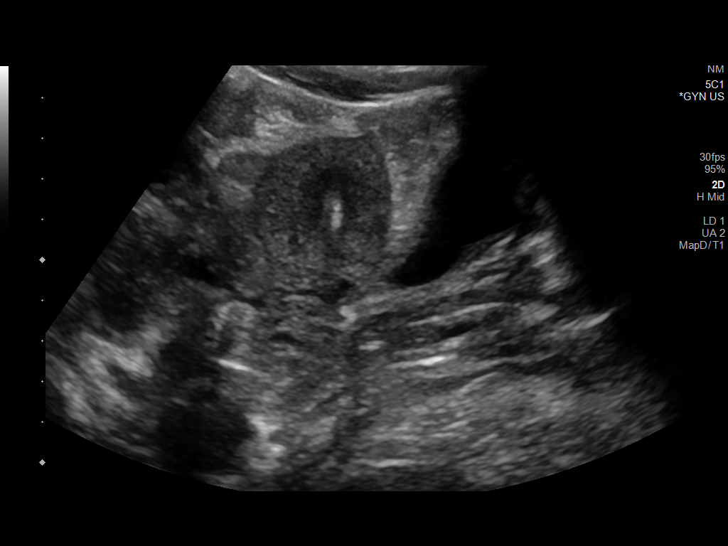
[im 15/88]
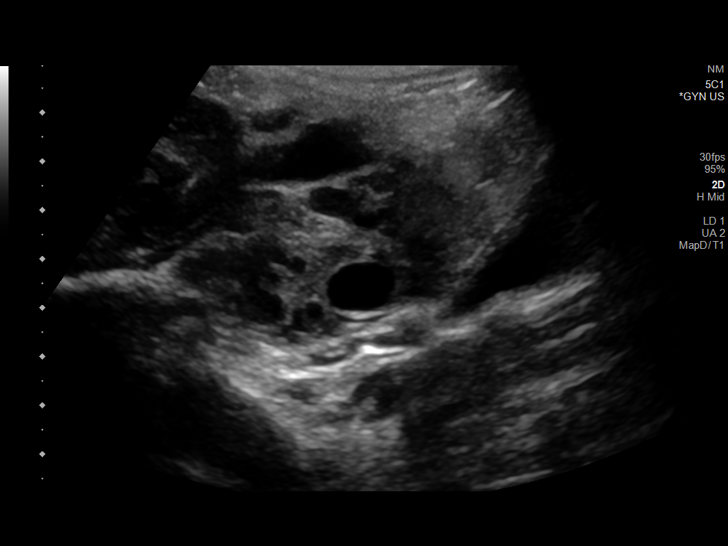
[im 22/88]
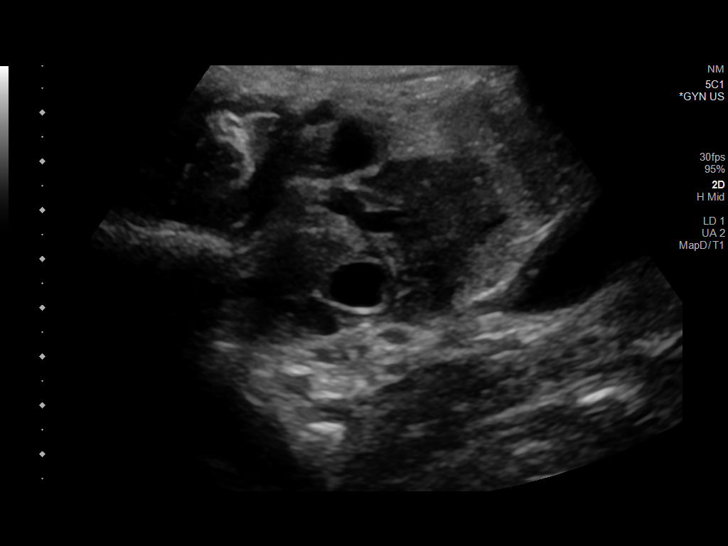
[im 30/88]
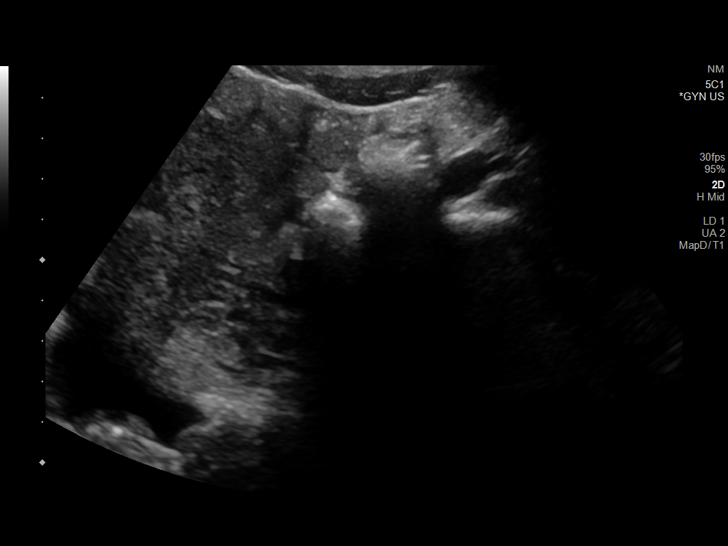
[im 37/88]
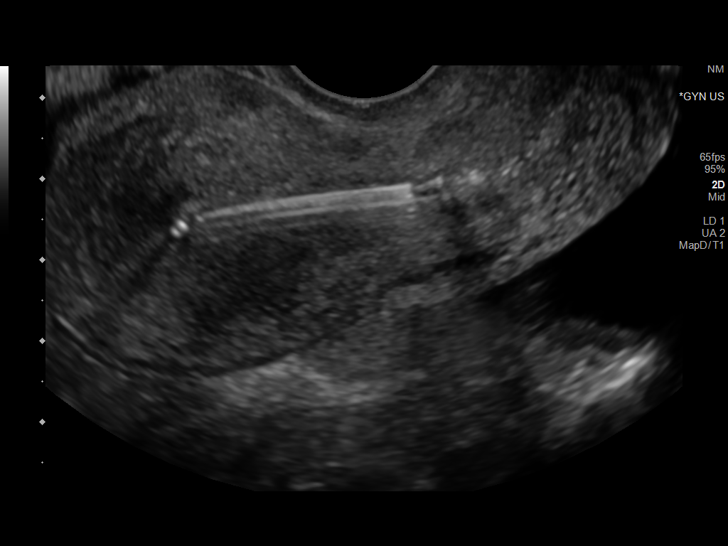
[im 44/88]
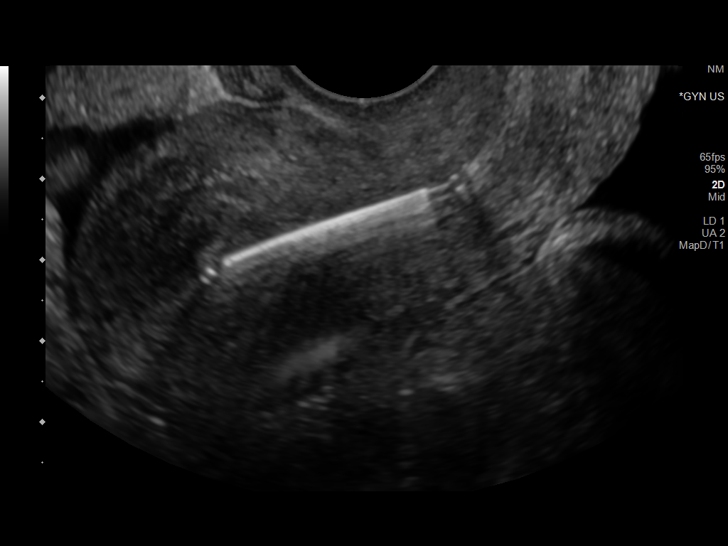
[im 51/88]
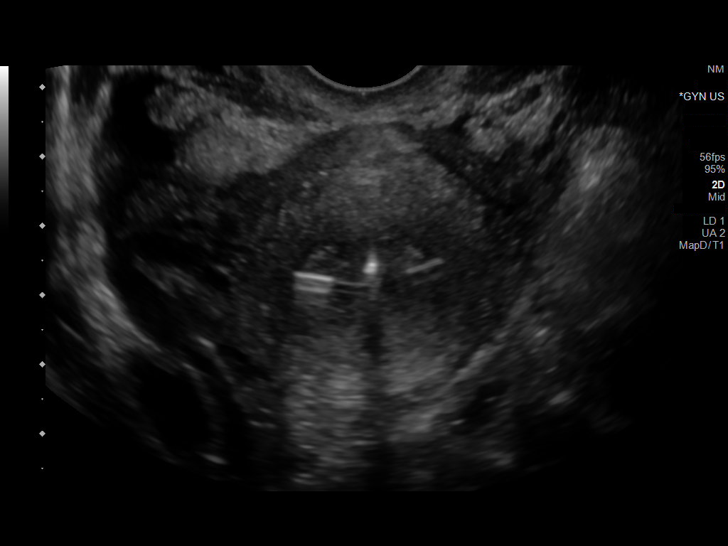
[im 59/88]
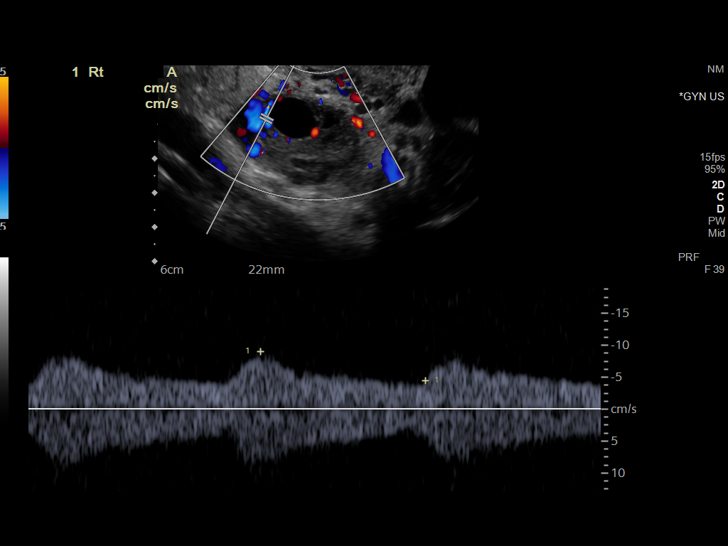
[im 66/88]
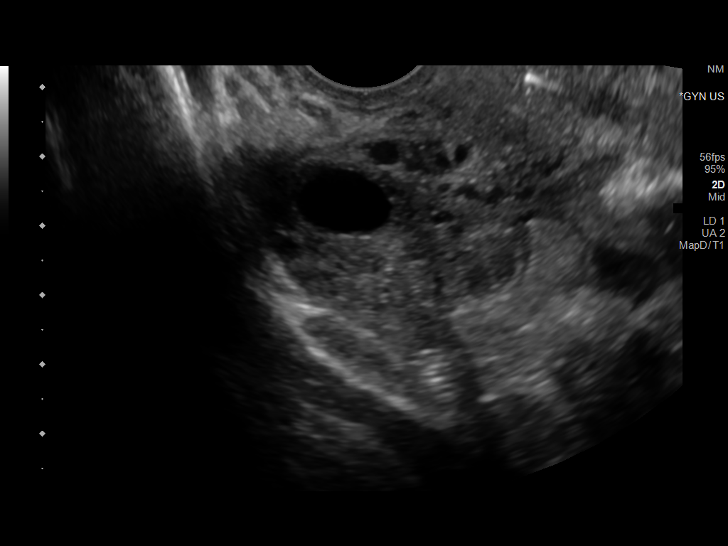
[im 73/88]
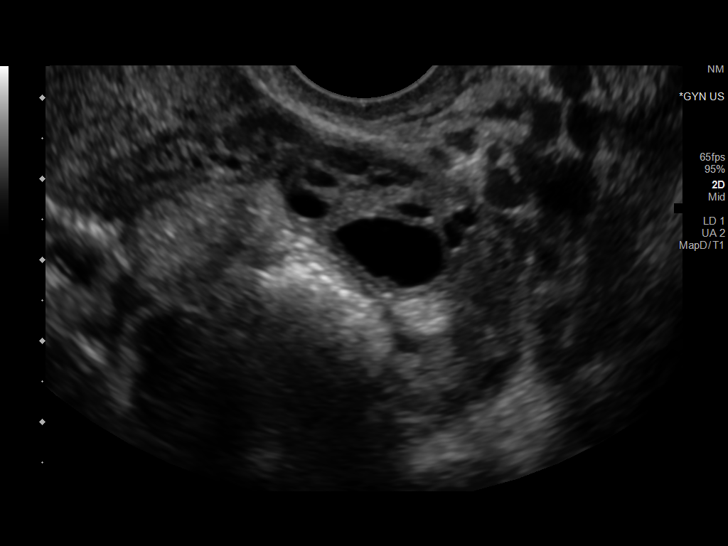
[im 80/88]
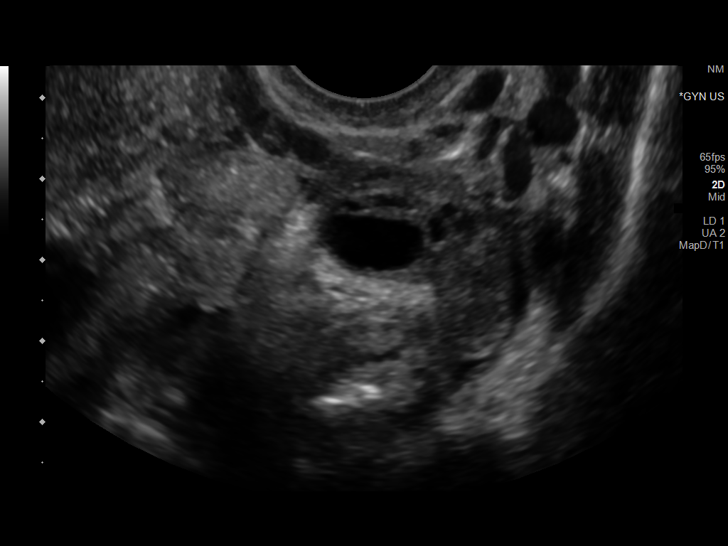
[im 88/88]
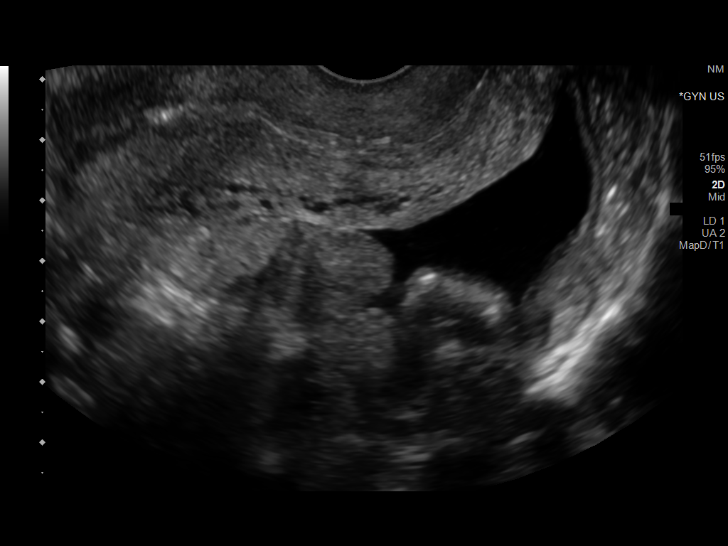

[13 of 25 positions shown; findings below may reference images not displayed]

FINDINGS: Uterus

Measurements: 7.9 x 3.6 x 4.2 cm = volume: 62.4 mL. No fibroids or
other mass visualized.

Endometrium

Thickness: IUD in place which obscures the endometrium which appears
thin. No focal abnormality visualized.

Right ovary

Measurements: 3.9 x 2.7 x 3.8 cm = volume: 20 mL. Normal
appearance/no adnexal mass. Multiple follicles, the largest 1.6 cm.

Left ovary

Measurements: 3.3 x 2.8 x 1.7 cm = volume: 8.1 mL. Multiple
follicles, the largest 1.5 cm. Normal appearance. No adnexal mass.

Pulsed Doppler evaluation of both ovaries demonstrates normal
low-resistance arterial and venous waveforms.

Other findings

Moderate free fluid in the pelvis.
IMPRESSION: Multiple ovarian follicles bilaterally.  No evidence of torsion.

Moderate free fluid.

IUD in expected position within the endometrial canal.
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
## 2022-02-21 ENCOUNTER — Inpatient Hospital Stay
Admit: 2022-02-21 | Discharge: 2022-02-21 | Disposition: A | Discharge status: Home / Self Care | Payer: MEDICAID | Attending: Emergency Medicine

## 2022-02-21 ENCOUNTER — Emergency Department: Admit: 2022-02-21 | Payer: MEDICAID

## 2022-02-21 DIAGNOSIS — R112 Nausea with vomiting, unspecified: Secondary | ICD-10-CM

## 2022-02-21 LAB — CBC WITH AUTO DIFFERENTIAL
Basophils %: 0.3 %
Basophils Absolute: 0.1 10*3/uL (ref 0.0–0.2)
Eosinophils %: 0 %
Eosinophils Absolute: 0 10*3/uL (ref 0.0–0.6)
Hematocrit: 43 % (ref 36.0–48.0)
Hemoglobin: 14.9 g/dL (ref 12.0–16.0)
Lymphocytes %: 5.6 %
Lymphocytes Absolute: 1.2 10*3/uL (ref 1.0–5.1)
MCH: 31.2 pg (ref 26.0–34.0)
MCHC: 34.6 g/dL (ref 31.0–36.0)
MCV: 90.2 fL (ref 80.0–100.0)
MPV: 8.8 fL (ref 5.0–10.5)
Monocytes %: 3.3 %
Monocytes Absolute: 0.7 10*3/uL (ref 0.0–1.3)
Neutrophils %: 90.8 %
Neutrophils Absolute: 19.4 10*3/uL — ABNORMAL HIGH (ref 1.7–7.7)
Platelets: 301 10*3/uL (ref 135–450)
RBC: 4.77 M/uL (ref 4.00–5.20)
RDW: 12.5 % (ref 12.4–15.4)
WBC: 21.3 10*3/uL — ABNORMAL HIGH (ref 4.0–11.0)

## 2022-02-21 LAB — URINALYSIS WITH REFLEX TO CULTURE
Bilirubin Urine: NEGATIVE
Glucose, Ur: NEGATIVE mg/dL
Ketones, Urine: 40 mg/dL — AB
Leukocyte Esterase, Urine: NEGATIVE
Nitrite, Urine: NEGATIVE
Protein, UA: 100 mg/dL — AB
Specific Gravity, UA: 1.025 (ref 1.005–1.030)
Urobilinogen, Urine: 0.2 E.U./dL (ref <2.0)
pH, UA: 6 (ref 5.0–8.0)

## 2022-02-21 LAB — COMPREHENSIVE METABOLIC PANEL W/ REFLEX TO MG FOR LOW K
ALT: 32 U/L (ref 10–40)
AST: 49 U/L — ABNORMAL HIGH (ref 15–37)
Albumin/Globulin Ratio: 1.2 (ref 1.1–2.2)
Albumin: 5.1 g/dL — ABNORMAL HIGH (ref 3.4–5.0)
Alkaline Phosphatase: 25 U/L — ABNORMAL LOW (ref 40–129)
Anion Gap: 21 — ABNORMAL HIGH (ref 3–16)
BUN: 21 mg/dL — ABNORMAL HIGH (ref 7–20)
CO2: 18 mmol/L — ABNORMAL LOW (ref 21–32)
Calcium: 10.1 mg/dL (ref 8.3–10.6)
Chloride: 99 mmol/L (ref 99–110)
Creatinine: 1.2 mg/dL — ABNORMAL HIGH (ref 0.6–1.1)
Est, Glom Filt Rate: >60 (ref >60)
Glucose: 85 mg/dL (ref 70–99)
Potassium reflex Magnesium: 4.6 mmol/L (ref 3.5–5.1)
Sodium: 138 mmol/L (ref 136–145)
Total Bilirubin: 0.7 mg/dL (ref 0.0–1.0)
Total Protein: 9.3 g/dL — ABNORMAL HIGH (ref 6.4–8.2)

## 2022-02-21 LAB — MICROSCOPIC URINALYSIS

## 2022-02-21 LAB — PREGNANCY, URINE: HCG(Urine) Pregnancy Test: NEGATIVE

## 2022-02-21 LAB — HCG, SERUM, QUALITATIVE: hCG Qual: NEGATIVE

## 2022-02-21 LAB — LIPASE: Lipase: 21 U/L (ref 13.0–60.0)

## 2022-02-21 MED ORDER — ALBUTEROL SULFATE HFA 108 (90 BASE) MCG/ACT IN AERS
108 (90 Base) MCG/ACT | Freq: Four times a day (QID) | RESPIRATORY_TRACT | 0 refills | Status: AC | PRN
Start: 2022-02-21 — End: ?

## 2022-02-21 MED ORDER — IOPAMIDOL 76 % IV SOLN
76 % | Freq: Once | INTRAVENOUS | Status: AC | PRN
Start: 2022-02-21 — End: 2022-02-21
  Administered 2022-02-21: 23:00:00 100 mL via INTRAVENOUS

## 2022-02-21 MED ORDER — ONDANSETRON HCL 4 MG/2ML IJ SOLN
4 MG/2ML | Freq: Once | INTRAMUSCULAR | Status: AC
Start: 2022-02-21 — End: 2022-02-21
  Administered 2022-02-21: 21:00:00 4 mg via INTRAVENOUS

## 2022-02-21 MED ORDER — SODIUM CHLORIDE 0.9 % IV BOLUS
0.9 % | Freq: Once | INTRAVENOUS | Status: AC
Start: 2022-02-21 — End: 2022-02-21
  Administered 2022-02-21: 21:00:00 1000 mL via INTRAVENOUS

## 2022-02-21 MED ORDER — ONDANSETRON 4 MG PO TBDP
4 MG | ORAL_TABLET | Freq: Three times a day (TID) | ORAL | 0 refills | Status: AC | PRN
Start: 2022-02-21 — End: ?

## 2022-02-21 MED FILL — ONDANSETRON HCL 4 MG/2ML IJ SOLN: 4 MG/2ML | INTRAMUSCULAR | Qty: 2

## 2022-02-21 NOTE — ED Notes (Signed)
Left without instructions     Earlie Server, RN  02/21/22 [phone]

## 2022-02-21 NOTE — ED Notes (Signed)
300cc of fluid infused  No emesis  Aware we need urine test 26 year old child is in the room with her     Earlie Server, RN  02/21/22 1739

## 2022-02-21 NOTE — ED Notes (Signed)
Return from Ct scan  Awake alert  No emesis     Earlie Server, RN  02/21/22 [phone]

## 2022-02-21 NOTE — ED Provider Notes (Signed)
[place] ENCOUNTER        Pt Name: Molly Oneal  MRN: [id number]  [place] 08-19-1996  Date of evaluation: 02/21/2022  Provider: Fredric Mare, MD  PCP: No primary care provider on file.  Note Started: 4:59 PM EDT 02/21/22    CHIEF COMPLAINT       Chief Complaint   Patient presents with    Emesis     Pt states she has been vomiting since last night, pt states she had a fever of 102 before arrival, pt reports not taking anything for fever, pt temp 97.9 upon arrival.     Asthma     Arrives via EMS with complaints of asthma exacerbation. Pt states she ran out of inhaler. EMS states lung sounds are clear en route. Pt is 100% on RA.          HISTORY OF PRESENT ILLNESS: 1 or more Elements     History from : Patient and EMS    Limitations to history : None    Molly Oneal is a 26 y.o. female who presents emergency department with nausea and vomiting.  This started last night.  She states that she had a fever of 102 before arrival but did not take anything for this.  She is afebrile here.  She denies abdominal pain.  She also states that she is short of breath and ran out of her inhalers.  She is speaking in full sentences and appears in no acute distress.  No cough, congestion.  No dysuria or hematuria.  Patient also states that she has panic attacks and feels very anxious right now.    Nursing Notes were all reviewed and agreed with or any disagreements were addressed in the HPI.    REVIEW OF SYSTEMS :      Review of Systems    10 systems reviewed and negative except as in HPI/MDM    SURGICAL HISTORY   History reviewed. No pertinent surgical history.    [place]       Discharge Medication List as of 02/21/2022  7:36 PM          ALLERGIES     Amoxicillin    FAMILYHISTORY     History reviewed. No pertinent family history.     SOCIAL HISTORY       Social History     Tobacco Use    Smoking status: Every Day     Types: Cigarettes    Smokeless tobacco: Never    Substance Use Topics    Drug use: Yes     Types: Marijuana (Weed)     Comment: daily marijuana       SCREENINGS        Glasgow Coma Scale  Eye Opening: Spontaneous  Best Verbal Response: Oriented  Best Motor Response: Obeys commands  Glasgow Coma Scale Score: 15                CIWA Assessment  BP: (!) 128/99  Heart Rate: 72           PHYSICAL EXAM  1 or more Elements     ED Triage Vitals   BP Temp Temp Source Heart Rate Resp SpO2 Height Weight   02/21/22 1645 02/21/22 1650 02/21/22 1650 02/21/22 1650 02/21/22 1650 02/21/22 1645 -- --   128/69 97.9 ??F (36.6 ??C) Oral 73 18 100 %         Physical Exam    My pulse  oximetry interpretation is which is within the normal range    GENERAL APPEARANCE: Awake and alert. Cooperative. No acute distress.  HEAD:  Atraumatic.  EYES: EOM's grossly intact.   ENT: Mucous membranes are moist.  No trismus.  NECK:  Trachea midline.  HEART: Regular rate and rhythm  LUNGS: Respirations unlabored. CTAB  ABDOMEN: Soft. Non-tender. No guarding or rebound.  EXTREMITIES: No acute deformities.  SKIN: Warm and dry.  NEUROLOGICAL: Moves all 4 extremities spontaneously.  PSYCHIATRIC: Appears anxious        DIAGNOSTIC RESULTS   LABS:    Labs Reviewed   URINALYSIS WITH REFLEX TO CULTURE - Abnormal; Notable for the following components:       Result Value    Ketones, Urine 40 (*)     Blood, Urine LARGE (*)     Protein, UA 100 (*)     All other components within normal limits   COMPREHENSIVE METABOLIC PANEL W/ REFLEX TO MG FOR LOW K - Abnormal; Notable for the following components:    CO2 18 (*)     Anion Gap 21 (*)     BUN 21 (*)     Creatinine 1.2 (*)     Total Protein 9.3 (*)     Albumin 5.1 (*)     Alkaline Phosphatase 25 (*)     AST 49 (*)     All other components within normal limits   CBC WITH AUTO DIFFERENTIAL - Abnormal; Notable for the following components:    WBC 21.3 (*)     Neutrophils Absolute 19.4 (*)     All other components within normal limits   MICROSCOPIC URINALYSIS - Abnormal;  Notable for the following components:    RBC, UA 5-10 (*)     Bacteria, UA 1+ (*)     All other components within normal limits   PREGNANCY, URINE   LIPASE   HCG, SERUM, QUALITATIVE       When ordered only abnormal lab results are displayed. All other labs were within normal range or not returned as of this dictation.    EKG:     RADIOLOGY:   Non-plain film images such as CT, Ultrasound and MRI are read by the radiologist. Plain radiographic images are visualized and preliminarily interpreted by the ED Provider with the below findings:        Interpretation per the Radiologist below, if available at the time of this note:    CT ABDOMEN PELVIS W IV CONTRAST Additional Contrast? None   Final Result   1. Normal appendix.   2. Distended stomach but no obvious of stomach outlet obstructive lesion.   Small bowel appears unremarkable.  Mild constipation.   3. No evidence of gallbladder or biliary disease.  No evidence of renal   stones or obstructive uropathy.           No results found.    No results found.    PROCEDURES   Unless otherwise noted below, none     Procedures    CRITICAL CARE TIME       EMERGENCY DEPARTMENT COURSE and DIFFERENTIAL DIAGNOSIS/MDM:   Vitals:    Vitals:    02/21/22 1645 02/21/22 1650 02/21/22 1724 02/21/22 1900   BP: 128/69   (!) 128/99   Pulse:  73  72   Resp:  18  16   Temp:  97.9 ??F (36.6 ??C)     TempSrc:  Oral     SpO2: 100%   98%  Weight:   95 lb (43.1 kg)    Height:   _0  (1.549 m)        Patient was given the following medications:  Medications   0.9 % sodium chloride bolus (0 mLs IntraVENous Stopped 02/21/22 1916)   ondansetron (ZOFRAN) injection 4 mg (4 mg IntraVENous Given 02/21/22 1729)   iopamidol (ISOVUE-370) 76 % injection 100 mL (100 mLs IntraVENous Given 02/21/22 1839)            Is this patient to be included in the SEP-1 Core Measure due to severe sepsis or septic shock?   No   Exclusion criteria - the patient is NOT to be included for SEP-1 Core Measure due to:  2+ SIRS  criteria are not met    PAST MEDICAL HISTORY      has a past medical history of Asthma.     CC/HPI Summary, DDx, ED Course, and Reassessment: Molly Oneal is a 26 y.o. female who presents emergency department with nausea and vomiting.  This started last night.  She states that she had a fever of 102 before arrival but did not take anything for this.  She is afebrile here.  She denies abdominal pain.  She also states that she is short of breath and ran out of her inhalers.  She is speaking in full sentences and appears in no acute distress.  No cough, congestion.  No dysuria or hematuria.  Patient also states that she has panic attacks and feels very anxious right now.    Patient's blood work at this time shows a white count of 21,000.  Rest of her electrolytes show a creatinine of 1.2 but the CO2 of 18 and an anion gap of 21.  Glucose level is normal.  At time of signout awaiting CT scan.  Please refer to oncoming providers note for disposition         I am the Primary Clinician of Record.    FINAL IMPRESSION      1. Nausea and vomiting, unspecified vomiting type          DISPOSITION/PLAN     DISPOSITION Decision To Discharge 02/21/2022 07:33:38 PM      PATIENT REFERRED TO:  No follow-up provider specified.    DISCHARGE MEDICATIONS:  Discharge Medication List as of 02/21/2022  7:36 PM        START taking these medications    Details   albuterol sulfate HFA (VENTOLIN HFA) 108 (90 Base) MCG/ACT inhaler Inhale 2 puffs into the lungs 4 times daily as needed for Wheezing, Disp-18 g, R-0Print      ondansetron (ZOFRAN-ODT) 4 MG disintegrating tablet Take 1 tablet by mouth 3 times daily as needed for Nausea or Vomiting, Disp-21 tablet, R-0Normal             DISCONTINUED MEDICATIONS:  Discharge Medication List as of 02/21/2022  7:36 PM                 (Please note that portions of this note were completed with a voice recognition program.  Efforts were made to edit the dictations but occasionally words are  mis-transcribed.)    Fredric Mare, MD (electronically signed)            Meliton Rattan, MD  02/22/22 1059

## 2022-02-21 NOTE — ED Notes (Signed)
Aware to return for any changes     Earlie Server, RN  02/21/22 1931

## 2022-02-21 NOTE — ED Notes (Signed)
She came out and took her IV out  She said her ride is here and wants to leave now  I told her I would call her with any important info  Walked out with child  She states child is hungry and has to leave     Earlie Server, RN  02/21/22 1929

## 2022-02-21 NOTE — ED Notes (Signed)
Report to [name] RN  Pt awaits Ct result  No emesis  Watching TV     Earlie Server, RN  02/21/22 1918

## 2022-02-21 NOTE — ED Notes (Signed)
Aware to increase fluids     Earlie Server, RN  02/21/22 1931

## 2022-03-07 NOTE — ED Notes (Signed)
 Formatting of this note might be different from the original.  Bed: 14  Expected date:   Expected time:   Means of arrival: WALK IN  Comments:  Electronically signed by Gaila Josephs, Registered Nurse at 03/07/2022 11:22 AM EDT

## 2022-03-07 NOTE — ED Notes (Signed)
 Formatting of this note might be different from the original.  Pt unable to tolerate PO contrast as she has emesis x1 when trying to drink it. MD and CT aware.   Electronically signed by Theadora Fines, Registered Nurse at 03/07/2022  2:02 PM EDT

## 2022-03-07 NOTE — ED Provider Notes (Signed)
 Formatting of this note is different from the original.  Images from the original note were not included.  [facility]  ED Encounter Arrival Date: 03/07/22 1059  Molly Oneal                            DOB: 1996/07/14  [address]  [place] [address]   MRN: [id number]   CSN: [id number]     HAR: [id number]     EMERGENCY DEPARTMENT - GENERAL NOTE    CHIEF COMPLAINT    Chief Complaint   Patient presents with   ? Abdominal Pain   ? Vomiting     HPI    Molly Oneal is a 26 year old female who presents with chief complaint of abdominal pain.  The patient reports 3 days of severe right lower quadrant and right flank pain she reports that she never had pain like this before has a previous left nephrectomy.  Reports associated nausea vomiting decreased appetite denies any diarrhea reports last time she ate was yesterday she reports last episode emesis was this morning she reports no fevers she reports that she has had no vaginal discharge or bleeding and reports she is currently on birth control which is the Depo shot that she received 3 weeks ago.    PAST MEDICAL HISTORY    Past Medical History:   Diagnosis Date   ? Ovarian cyst 2015     SURGICAL HISTORY    Past Surgical History:   Procedure Laterality Date   ? *CARDIAC     ? OVARIAN CYST REMOVAL       CURRENT MEDICATIONS    Prior to Admission medications    Medication Sig Start Date End Date Taking? Authorizing Provider   ibuprofen (ADVIL,MOTRIN) 600 MG TABS Take 1 tablet by mouth 4 (four) times daily as needed for Mild Pain (1-3). 01/06/22   Manning, Megan Beatrice, PA-C   ondansetron  (ZOFRAN -ODT) 4 MG TBDP Take 1 tablet by mouth every 8 (eight) hours as needed. 01/06/22   Manning, Megan Beatrice, PA-C   ondansetron  (ZOFRAN ) 4 mg tablet Take 1 tablet by mouth every 8 (eight) hours as needed. 07/05/20   Angel Barba., PA-C   cyclobenzaprine (FLEXERIL) 10 MG TABS Take 1 tablet by mouth every 8 (eight) hours as needed. 12/02/19    Andriette Banner, DO   naproxen (NAPROSYN) 500 MG TABS Take 1 tablet by mouth 2 (two) times daily with meals. 12/02/19   Andriette Banner, DO     ALLERGIES    Allergies   Allergen Reactions   ? Amoxicillin Hives     FAMILY HISTORY    No family history on file.    SOCIAL HISTORY    Social History     Socioeconomic History   ? Marital status: Single   Tobacco Use   ? Smoking status: Every Day     Packs/day: 1.00     Types: Cigarettes   ? Smokeless tobacco: Never   Vaping Use   ? Vaping Use: Never used   Substance and Sexual Activity   ? Alcohol use: No   ? Drug use: Yes     Types: Marijuana     Comment: daily      REVIEW OF SYSTEMS    Constitutional: Negative for chills or fever  HENT: Negative for sore throat.    Eyes: Negative for visual disturbance   Respiratory: Negative for  shortness of breath.    Cardiovascular: Negative for palpitations.   Gastrointestinal: For right-sided abdominal and flank pain  Genitourinary: Negative for dysuria  Musculoskeletal: Negative for back pain.   Skin: Negative for rash.   Neurological: Negative for focal weakness  Psychiatric/Behavioral: Negative for depression   All systems negative except as marked.     PHYSICAL EXAM    VITAL SIGNS: BP 110/58   Pulse 95   Temp 98.5 F (36.9 C) (Oral)   Resp 18   SpO2 100%    Constitutional: Cooperative  HENT:  Normocephalic, Atraumatic, Bilateral external ears normal, Oropharynx moist, No oral exudates, Nose normal. Neck- Normal range of motion, No tenderness, Supple, No stridor.   Eyes:  PERRL, EOMI, Conjunctiva normal, No discharge.   Respiratory:  Normal breath sounds, No respiratory distress, No wheezing, No chest tenderness.   Cardiovascular:  Normal heart rate, Normal rhythm, No murmurs, No rubs, No gallops.   GI:  Bowel sounds normal, Soft, right lower quadrant tenderness, No masses, No pulsatile masses.   GU: External genitalia appear normal, No masses or lesions. No discharge. No CVA tenderness.   Musculoskeletal:   Intact distal pulses, No edema, No tenderness, No cyanosis, No clubbing. Good range of motion in all major joints. No tenderness to palpation or major deformities noted. Back- No tenderness.   Integument:  Warm, Dry, No erythema, No rash.   Lymphatic:  No lymphadenopathy noted.   Neurologic:  Alert & oriented x 3, Normal motor function, Normal sensory function, No focal deficits noted.   Psychiatric:  Affect normal, Judgment normal, Mood normal.     EKG      Independently interpreted by Darien Eden. Nolan Battle, MD    LABORATORY  Recent Results (from the past 24 hour(s))   CBC with Differential    Collection Time: 03/07/22 11:35 AM   Result Value Ref Range    WBC 17.7 (H) 3.6 - 10.5 THOU/mcL    RBC 4.44 3.80 - 5.20 MIL/mcL    HEMOGLOBIN 13.9 12.0 - 15.2 g/dL    HEMATOCRIT 16.1 36 - 46 %    MCV 90.2 82 - 97 fL    MCH 31.3 27 - 33 pg    MCHC 34.7 32 - 36 g/dL    RDW 09.6 04.5 - 40.9 %    PLATELET 147 140 - 375 THOU/mcL    MPV 9.1 7.4 - 11.5 fL    ABS. NEUTROPHIL 16.10 (H) 1.80 - 7.70 THOU/mcL    ABS LYMPHS 0.60 (L) 1.00 - 4.00 THOU/mcL    ABS MONOS 0.90 0.20 - 0.90 THOU/mcL    ABS EOS 0.00 (L) 0.03 - 0.45 THOU/mcL    ABS BASOS 0.10 0.00 - 0.20 THOU/mcL    SEGS 92 %    LYMPHOCYTES 3 %    MONOCYTES 5 %    EOSINOPHIL 0 %    BASOPHILS 0 %   Hepatic Function Panel (TBIL,DBIL,ALK,AST,ALT,TP,ALB)    Collection Time: 03/07/22 11:35 AM   Result Value Ref Range    ALK PHOSPHATASE 50 35 - 135 IU/L    TOTAL PROTEIN 8.8 (H) 6.0 - 8.0 g/dL    ALBUMIN 4.3 3.5 - 5.7 g/dL    AST 27 10 - 40 IU/L    ALT 21 10 - 60 IU/L    TOTAL BILIRUBIN 3.0 (H) 0.0 - 1.2 mg/dL    DIRECT BILIRUBIN 0.7 (H) 0.0 - 0.2 mg/dL   Lipase    Collection Time: 03/07/22 11:35 AM   Result  Value Ref Range    LIPASE 16 11 - 82 U/L   BAMP (Na,K,Cl,CO2,Glu,BUN,Creat,Ca)    Collection Time: 03/07/22 11:35 AM   Result Value Ref Range    BLD UREA NITROGEN 14 8 - 26 mg/dL    SODIUM 161 (L) 096 - 145 mEq/L    POTASSIUM 3.3 (L) 3.6 - 5.1 mEq/L    CHLORIDE 96 (L) 98 - 111 mEq/L     CO2 22 21 - 31 mmol/L    GLUCOSE, RANDOM 114 (H) 70 - 99 mg/dL    CREATININE 0.45 (H) 0.60 - 1.20 mg/dL    ANION GAP 9 6 - 18 mmol/L    CALCIUM 9.6 8.5 - 10.4 mg/dL    ESTIMATED GFR 41 (L) >59 mL/min/1.73 m2   Urinalysis with Reflex to Microscopic and Culture    Collection Time: 03/07/22  1:01 PM   Result Value Ref Range    UA SPECIMEN SOURCE Urine, Clean Catch     URINE TYPE Urine     COLOR Yellow Yellow    APPEARANCE, URINE TURBID (A) Clear    PH, URINE 6.5 5.0 - 8.0    SPEC. GRAVITY, URINE 1.015 1.005 - 1.029    PROTEIN, URINE 2+ (100-200 mg/dL) (A) NEGATIVE    GLUCOSE, URINE NEGATIVE NEGATIVE    KETONE, URINE Trace (A) NEGATIVE    BILIRUBIN, URINE NEGATIVE NEGATIVE    BLOOD, URINE 2+ (0.2-0.5 mg/dL) (A) NEGATIVE    UROBILINOGEN, URINE 1+ (2-3 mg/dL) (A) NORMAL mg/dL    NITRITE, URINE POSITIVE (A) NEGATIVE    LEUKOCYTE ESTERASE, URINE 4+ (500 Leu/uL) (A) NEGATIVE    RBC, URINE 6 to 10 (A) <6 /HPF    WBC, URINE >50 (A) <6 /HPF    SQUAMOUS EPI CELLS <6 /HPF    BACTERIA FEW (A) NONE /HPF   POCT URINALYSIS    Collection Time: 03/07/22  1:05 PM   Result Value Ref Range    COLOR,POC URINE Yellow Yellow    CLARITY,POC URINE Clear Clear    PH,POC URINE 6.0 5.0 - 8.0    SPEC GRAVITY POC UR 1.015 1.005 - 1.029    PROTEIN,POC URINE 3+ (A) NEGATIVE    GLUCOSE,POC URINE NEGATIVE NEGATIVE    KETONES,POC URINE 1+ (A) NEGATIVE    BILIRUBIN,POC URINE NEGATIVE NEGATIVE    BLOOD,POC URINE 2+ (A) NEGATIVE    UROBILINOGEN,POC URINE 2.0 (H) <2.0    NITRITE,POC URINE POSITIVE (A) NEGATIVE    LEUK ESTERASE,POC URN 2+ (A) NEGATIVE   POCT GONADOT HCG QUALITATIVE    Collection Time: 03/07/22  1:10 PM   Result Value Ref Range    HCG,POC URINE NEGATIVE NEGATIVE     RADIOLOGY    CT ABDOMEN PELVIS W CONTRAST   Final Result       CT findings compatible with acute right pyelonephritis. No evidence of abscess or hydronephrosis.     Normal appendix.     Small hepatic cyst or hemangioma.                       ED COURSE & MEDICAL DECISION MAKING       History obtained from independent historian: no  The patient was able to provide the full history and participate in the medical decision making    External medical records reviewed: I reviewed 2 clinic visits both in March 1 March 6 with the patient where she was given a Depo-Provera injection and I also reviewed a visit on  March 13 where she had her IUD removed as well as a visit to the emergency department on March 21 for vomiting    Initial Assessment and Plan:    Briefly, this is a 26 year old female who presents with   Chief Complaint   Patient presents with   ? Abdominal Pain   ? Vomiting   .    Initial assessment and rationale for testing ordered: Patient presents with severe right-sided abdominal pain therefore my concern was for an acute intra-abdominal process therefore imaging and laboratory studies are obtained as well as urinalysis    Workup and Therapy:    Differential Diagnosis: This included acute appendicitis as well as acute pyelonephritis as well as acute cholecystitis kidney stone UTI as well as the possibility of an ovarian cyst or the possibility of an abnormal pregnancy    Pertinent Labs & Imaging studies reviewed from this ED visit. Previous pertinent medical records were reviewed as above.  The patient was given the following medication in the Emergency department:  Medication Administration from 03/07/2022 1059 to 03/07/2022 1608     Date/Time Order Dose Route Action Action by Comments    03/07/2022 1159 EDT sodium chloride  0.9% infusion 1,000 mL Intravenous New Bag Carlie Wayland --    03/07/2022 1334 EDT sodium chloride  0.9% infusion 0 mL Intravenous Completed Carlie Wayland --    03/07/2022 1200 EDT ketorolac (TORADOL) intraVENOUS injection 30 mg 30 mg Intravenous Given Carlie Wayland --    03/07/2022 1200 EDT ondansetron  (ZOFRAN ) injection 4 mg 4 mg Intravenous Given Carlie Wayland --    03/07/2022 1344 EDT barium sulfate (READI-CAT 2) 2 % oral suspension 450 mL 450 mL Oral Not  Given Carlie Wayland Pt unable to tolerate    03/07/2022 1423 EDT ioversol (OPTIRAY 350) 74 % injection 100 mL 100 mL Intravenous Contrast Given Argie Kung --    03/07/2022 1518 EDT cefTRIAXone (ROCEPHIN) 1 g in sodium chloride  0.9 % 50 mL IVPB (RN MIX) 1 g Intravenous New Bag Carlie Wayland --    03/07/2022 1550 EDT cefTRIAXone (ROCEPHIN) 1 g in sodium chloride  0.9 % 50 mL IVPB (RN MIX) 0 g Intravenous Completed Carlie Wayland --       Patient does present with severe right-sided flank and abdominal pain.  She is given IV medication for pain as well as IV fluid hydration urinalysis comes back positive for underlying infection though there is a negative pregnancy.  She does have a white count that is elevated as well at 17,000.  Underlying bilirubin is also mildly elevated at 3.  The patient does not appear to be jaundiced.  The patient at this time is feeling somewhat better nausea significant improved she is given IV fluid hydration as well.  I did want obtain imaging on the patient her body mass index is quite low therefore I tried to do oral contrast for a CT but she did not tolerate it well but a CT was still obtained this was reviewed by radiology and interpreted by radiology and reviewed by myself showing evidence of what appears to be acute pyelonephritis with no sign of appendicitis or cholecystitis.  Patient was given a gram of Rocephin she is feeling better she is discharged home with Ceftin as well as oral medication for pain and nausea.  We did discuss the possibility of admission to the hospital but the patient would prefer to try to go home on an outpatient trial for treatment which I felt was appropriate given  that she is no longer vomiting and is not pregnant    C.      Disposition/ Plan:    Disposition:   Patient was given the signs and symptoms of worsening condition and told to return if they occurred.  Patient understands the plan and management and all questions were answered.  Patient will  follow up with her primary care physician in the next 2-3 days.  Patient was instructed on Culture results and preliminary radiology reads and the she would be contacted if a variance or positive test is obtained.  The nursing note was reviewed, agreed and appreciated.      Final Diagnosis:    1. Acute pyelonephritis      The patient was given the following medications to go home with:      Medication List     START taking these medications    cefUROXime 250 mg tablet  Commonly known as: CEFTIN  Take 1 tablet by mouth 2 (two) times daily for 10 days.      CHANGE how you take these medications    * naproxen 500 MG Tabs  Commonly known as: Naprosyn  Take 1 tablet by mouth 2 (two) times daily with meals.  What changed: Another medication with the same name was added. Make sure you understand how and when to take each.    * naproxen 500 MG Tabs  Commonly known as: Naprosyn  Take 1 tablet by mouth 2 (two) times daily with meals.  What changed: You were already taking a medication with the same name, and this prescription was added. Make sure you understand how and when to take each.    * ondansetron  4 MG Tbdp  Commonly known as: ZOFRAN -ODT  Take 1 tablet by mouth every 8 (eight) hours as needed.  What changed: Another medication with the same name was added. Make sure you understand how and when to take each.    * ondansetron  4 MG Tbdp  Commonly known as: ZOFRAN -ODT  Take 1 tablet by mouth every 8 (eight) hours as needed.  What changed: You were already taking a medication with the same name, and this prescription was added. Make sure you understand how and when to take each.       * This list has 4 medication(s) that are the same as other medications prescribed for you. Read the directions carefully, and ask your doctor or other care provider to review them with you.         ASK your doctor about these medications    cyclobenzaprine 10 MG Tabs  Commonly known as: FLEXERIL  Take 1 tablet by mouth every 8 (eight) hours as  needed.    ibuprofen 600 MG Tabs  Commonly known as: ADVIL,MOTRIN  Take 1 tablet by mouth 4 (four) times daily as needed for Mild Pain (1-3).    ondansetron  4 mg tablet  Commonly known as: ZOFRAN   Take 1 tablet by mouth every 8 (eight) hours as needed.        Where to Get Your Medications     These medications were sent to [facility] [place], [place] - [address]  [address], [place] [address]    Hours: 9am-5:30pm Mon-Fri, 9am-12:30pm Sat Phone: [phone]    cefUROXime 250 mg tablet   naproxen 500 MG Tabs   ondansetron  4 MG Tbdp      Follow-up Information     Follow up With Specialties Details Why Contact  Info    Leisgang, Kaylene Pascal., MD Internal Medicine Call in 1 day  [facility] Internal Medicine  [address]  [place] [address]  [phone]          Kaye Parsons., MD  03/07/22 [phone]    Electronically signed by Kaye Parsons., MD at 03/07/2022  5:16 PM EDT

## 2022-03-07 NOTE — Unmapped (Signed)
 Formatting of this note might be different from the original.  [facility]  Antimicrobial Patient Follow-Up  Patient Demographics  The patient was seen by Dr Nolan Battle at [facility] on 03/07/22.  Culture Results  A blood culture 1/2 grew gram negative rods.    Treatment Recommendation  Spoke with patient, and she states she has continued sweats and N/V and discussed with Dr. Fredy Jericho. It is recommended that the patient stop taking cefuroxime and start taking cefdinir 300mg  BID x10 days or return to the ED for further IV antibiotic management.  This recommendation was reviewed and agreed upon by Dr Fredy Jericho.  Follow-Up  After attempting to reach the patient again, The patient was not able to be contacted at the phone number provided and notified of the change so a letter was sent to the address documented in the patient's chart.    No primary care physician on file, unable to notify PCP.  Lauris Port, PharmD  03/08/2022 11:26 AM    Electronically signed by Lauris Port, PharmD at 03/08/2022 11:32 AM EDT

## 2022-03-07 NOTE — ED Notes (Signed)
 Formatting of this note might be different from the original.  Pt presents to the ED from home with right sided abdominal pain, N/V and loss of appetite x3 days.   Electronically signed by Suezanne Emperor, Registered Nurse at 03/07/2022 11:05 AM EDT

## 2022-03-07 NOTE — Progress Notes (Signed)
Formatting of this note might be different from the original.  CURRENT STATUS IS EMERGENCY .     :    Any questions regarding PATIENT CLASS/STATUS should be directed to the Care Management Departments:  [facility] [phone] or [facility] [phone] or [facility]  [phone].  Electronically signed by Notify, [facility], MD at 03/08/2022  6:52 AM EDT

## 2024-04-15 ENCOUNTER — Inpatient Hospital Stay
Admit: 2024-04-15 | Discharge: 2024-04-15 | Disposition: A | Discharge status: Home / Self Care | Payer: Medicaid (Managed Care) | Attending: Student in an Organized Health Care Education/Training Program

## 2024-04-15 ENCOUNTER — Emergency Department: Admit: 2024-04-15 | Payer: Medicaid (Managed Care)

## 2024-04-15 DIAGNOSIS — N730 Acute parametritis and pelvic cellulitis: Secondary | ICD-10-CM

## 2024-04-15 DIAGNOSIS — A599 Trichomoniasis, unspecified: Secondary | ICD-10-CM

## 2024-04-15 LAB — WET PREP, GENITAL
Clue Cells, Wet Prep: NONE SEEN
Yeast, Wet Prep: NONE SEEN

## 2024-04-15 LAB — URINALYSIS WITH REFLEX TO CULTURE
Glucose, Ur: NEGATIVE mg/dL
Ketones, Urine: >=80 mg/dL — AB
Nitrite, Urine: NEGATIVE
Protein, UA: >=300 mg/dL — AB
Specific Gravity, UA: 1.025 (ref 1.005–1.030)
Urobilinogen, Urine: 4 U/dL — AB (ref <2.0)
pH, Urine: 6 (ref 5.0–8.0)

## 2024-04-15 LAB — MICROSCOPIC URINALYSIS

## 2024-04-15 LAB — RAPID INFLUENZA A/B ANTIGENS
Rapid Influenza A Ag: NEGATIVE
Rapid Influenza B Ag: NEGATIVE

## 2024-04-15 LAB — PREGNANCY, URINE: Pregnancy, Urine: NEGATIVE

## 2024-04-15 LAB — COVID-19, RAPID: SARS-CoV-2, NAAT: NOT DETECTED

## 2024-04-15 MED ORDER — DOXYCYCLINE HYCLATE 100 MG PO TABS
100 | ORAL_TABLET | Freq: Two times a day (BID) | ORAL | 0 refills | 7.00000 days | Status: AC
Start: 2024-04-15 — End: 2024-04-29

## 2024-04-15 MED ORDER — CEFTRIAXONE SODIUM 500 MG IJ SOLR
500 | Freq: Once | INTRAMUSCULAR | Status: AC
Start: 2024-04-15 — End: 2024-04-15
  Administered 2024-04-15: 16:00:00 500 mg via INTRAMUSCULAR

## 2024-04-15 MED ORDER — METRONIDAZOLE 500 MG PO TABS
500 | Freq: Once | ORAL | Status: AC
Start: 2024-04-15 — End: 2024-04-15
  Administered 2024-04-15: 16:00:00 500 mg via ORAL

## 2024-04-15 MED ORDER — DOXYCYCLINE HYCLATE 100 MG PO TABS
100 | Freq: Once | ORAL | Status: AC
Start: 2024-04-15 — End: 2024-04-15
  Administered 2024-04-15: 16:00:00 100 mg via ORAL

## 2024-04-15 MED ORDER — METRONIDAZOLE 500 MG PO TABS
500 | ORAL_TABLET | Freq: Two times a day (BID) | ORAL | 0 refills | 7.00000 days | Status: AC
Start: 2024-04-15 — End: 2024-04-29

## 2024-04-15 MED ORDER — ONDANSETRON 4 MG PO TBDP
4 | Freq: Once | ORAL | Status: AC
Start: 2024-04-15 — End: 2024-04-15
  Administered 2024-04-15: 16:00:00 4 mg via ORAL

## 2024-04-15 MED FILL — METRONIDAZOLE 500 MG PO TABS: 500 MG | ORAL | Qty: 1 | Fill #0

## 2024-04-15 MED FILL — ONDANSETRON 4 MG PO TBDP: 4 MG | ORAL | Qty: 1 | Fill #0

## 2024-04-15 MED FILL — CEFTRIAXONE SODIUM 500 MG IJ SOLR: 500 MG | INTRAMUSCULAR | Qty: 500 | Fill #0

## 2024-04-15 MED FILL — DOXYCYCLINE HYCLATE 100 MG PO TABS: 100 MG | ORAL | Qty: 1 | Fill #0

## 2024-04-15 NOTE — ED Provider Notes (Signed)
 EMERGENCY DEPARTMENT ENCOUNTER      CHIEF COMPLAINT    Foreign Body in Vagina (Thinks she has had tampon in for two weeks she thinks she started to have an odor about 4 days after placing ), Dysuria (Painful urination ), and Concern For COVID-19 (Cough congestion , fever chills for the past 4 days )      HPI    Molly Oneal is a 28 y.o. female with past medical history significant for asthma who presents with foreign body vagina and upper respiratory symptoms  Patient is here today because she believes that her tampon has been in the vagina for the last 2 weeks, also return for COVID with cough congestion runny nose chills for the last 4 days  Positive for some painful urination    PAST MEDICAL HISTORY    Past Medical History:   Diagnosis Date    Asthma        SURGICAL HISTORY    History reviewed. No pertinent surgical history.    CURRENT MEDICATIONS    Current Outpatient Rx   Medication Sig Dispense Refill    metroNIDAZOLE (FLAGYL) 500 MG tablet Take 1 tablet by mouth 2 times daily for 14 days 28 tablet 0    doxycycline hyclate (VIBRA-TABS) 100 MG tablet Take 1 tablet by mouth 2 times daily for 14 days 28 tablet 0    albuterol  sulfate HFA (VENTOLIN  HFA) 108 (90 Base) MCG/ACT inhaler Inhale 2 puffs into the lungs 4 times daily as needed for Wheezing 18 g 0    ondansetron  (ZOFRAN -ODT) 4 MG disintegrating tablet Take 1 tablet by mouth 3 times daily as needed for Nausea or Vomiting 21 tablet 0       ALLERGIES    Allergies   Allergen Reactions    Amoxicillin Hives       Family history reviewed and noncontributory other than:  History reviewed. No pertinent family history.    Social history reviewed and noncontributory other than:  Social History     Socioeconomic History    Marital status: Single     Spouse name: Not on file    Number of children: Not on file    Years of education: Not on file    Highest education level: Not on file   Occupational History    Not on file   Tobacco Use    Smoking status: Every Day      Types: Cigarettes    Smokeless tobacco: Never   Substance and Sexual Activity    Alcohol use: Not on file    Drug use: Yes     Types: Marijuana Molly Oneal)     Comment: daily marijuana    Sexual activity: Not on file   Other Topics Concern    Not on file   Social History Narrative    Not on file     Social Drivers of Health     Financial Resource Strain: Not on File (07/29/2018)    Received from [employer]     Financial Resource Strain: 0   Food Insecurity: Not on File (08/30/2023)    Received from [employer] [facility]     Food: 0   Transportation Needs: Unknown (12/26/2022)    Received from [facility] and [employer]     Worried about transportation: Not on file   Physical Activity: Not on File (07/29/2018)    Received from [facility]    Physical  Activity     Physical Activity: 0   Stress: Not on File (07/29/2018)    Received from [facility]    Stress     Stress: 0   Social Connections: Not on File (08/24/2023)    Received from [facility]    Social Connections     Connectedness: 0   Intimate Partner Violence: Unknown (12/26/2022)    Received from [facility] and [employer]     Feel physically or emotionally unsafe where currently live: Not on file     Harm by anyone: Not on file     Emotionally Harmed: Not on file   Housing Stability: Unknown (12/26/2022)    Received from [facility] and [employer]    Housing/Utilities     Worried about losing home: Not on file     Stayed outside house: Not on file     Unable to get utilities: Not on file       REVIEW OF SYSTEMS    As per HPI    PHYSICAL EXAM    BP 110/78   Pulse 89   Temp 97.8 F (36.6 Oneal)   Resp 22   Wt 38.9 kg (85 lb 12.1 oz)   SpO2 99%   BMI 16.20 kg/m   Physical Exam  Constitutional:       Appearance: Normal appearance. She is not ill-appearing.   HENT:      Head: Normocephalic and atraumatic.   Eyes:      Conjunctiva/sclera: Conjunctivae normal.   Cardiovascular:       Rate and Rhythm: Normal rate.      Heart sounds: Normal heart sounds.   Pulmonary:      Effort: Pulmonary effort is normal. No respiratory distress.   Abdominal:      General: Abdomen is flat.      Palpations: Abdomen is soft.      Tenderness: There is no abdominal tenderness.   Genitourinary:     Comments: Greenish-yellow discharge from the cervix, did an extensive examination with speculum unable to visualize any foreign bodies no tampons.  Also did a bimanual examination positive cervical motion tenderness, no adnexal tenderness and again no foreign bodies appreciated on bimanual exam  Musculoskeletal:      Cervical back: Neck supple.   Skin:     General: Skin is warm and dry.   Neurological:      General: No focal deficit present.      Mental Status: She is alert and oriented to person, place, and time.   Psychiatric:         Mood and Affect: Mood normal.         XR CHEST (2 VW)   Final Result   No acute process.             ED COURSE & MEDICAL DECISION MAKING    Molly Oneal is a 28 y.o. female  with past medical history significant for asthma who presents with foreign body vagina and upper respiratory symptoms.    DDX includes: PID, trichomonas, toxic shock syndrome, viral illness, pneumonia.  Plan:  Orders Placed This Encounter   Procedures    COVID-19, Rapid    Rapid influenza A/B antigens    Culture, Urine    Wet prep, genital    Oneal.trachomatis N.gonorrhoeae DNA    XR CHEST (2 VW)    Urinalysis with Reflex to Culture    Pregnancy, Urine    Microscopic Urinalysis   Health - [facility]        Medical reasoning and significant workup findings:   No acute distress, vital signs are noted for slight tachycardia  28 year old female here today with upper story symptoms and vaginal discharge.  At this time there is no evidence of foreign body on examination.  Did compressibility talk to suck syndrome, believe she be more ill-appearing at this time and again without any evidence of foreign body feels  less likely.  She did test positive for trichomonas she does have some CMT I do believe is reasonable to treat patient for PID.  Will obtain chest x-ray to evaluate for possible pneumonia.  Denies any abdominal pain no adnexal tenderness.  Dissipate patient be discharged home  Patient received a dose of Rocephin 500 mg IM will be started on Flagyl and doxycycline    ED Course as of 04/15/24 1503   Tue Apr 15, 2024   1127 Patient's urinalysis 21-50 white blood cells, given she is positive for trichomoniasis the white count in the urine could be related to underlying trichomonas infection.  Will hold off on treatment for UTI, will be treated for PID.  With a 14-day course of doxycycline and Flagyl.  Return precaution discussed at length with the patient [BT]      ED Course User Index  [BT] Alston Jerry, MD       Impression: PID    Is this patient to be included in the SEP-1 core measure?      Patient's results were discussed with the patient. Patient's questions were answered. I reviewed the patients medical records and noted there allergies, past medical history, and previous visits, pertinent information summarized in HPI. I reviewed the nursing notes.    Feel that patient is appropriate for discharge to follow up with PCP. Patient agreeable with this plan. Patient provided with prescription for doxy/flagyl. Return precautions given. Patient discharged in stable condition.            Molly Cozzolino C, MD  04/15/24 [phone]

## 2024-04-15 NOTE — Discharge Instructions (Signed)
 Today you are seen here in emergency department for vaginal discharge as well as upper respiratory symptoms.  This time we do believe you likely have pelvic inflammatory disease.  Please use the antibiotic as directed.  We do not see any evidence of foreign body here today.  If you start noticing you are getting worse at home having fevers you return back to the emergency department for these reasons.  Otherwise please return for difficulty breathing, new/concerning symptoms.  Your x-ray can be seen below.    XR CHEST (2 VW)   Final Result   No acute process.               You were tested today for STIs. You were treated today for Gonorrhea and chlamydia.     Your Chlamydia and Gonorrhea test results should be back within 1-2 days and you will be contacted at the confidential number you gave us  today.     If any of the results come back positive you should contact all of your current and recent sexual partners so that they can get tested and treated.    You should abstain from sex for at least 2 weeks or until all symptoms have resolved so as not to infect others.     It is very important that you follow up with your regular doctor or an STI clinic for further follow up, reevaluation, and testing (HIV, Hepatitis, syphilis, etc) in the future.   You may also follow up with the [facility] for STI testing, call [phone] for an appointment.    Use condoms with all sexual partners!

## 2024-04-15 NOTE — ED Notes (Signed)
 Pt d/c home with AVS no s.s of distress noted script x 2 sent to pharmacy pt denies questions about f/u

## 2024-04-15 NOTE — ED Notes (Addendum)
 Thinks she has had tampon in for two weeks she thinks she started to have an odor about 4 days after placing   She reports dysuria and has a concern for covid

## 2024-04-16 LAB — C.TRACHOMATIS N.GONORRHOEAE DNA
C. trachomatis DNA: NEGATIVE
N. gonorrhoeae DNA: NEGATIVE

## 2024-04-17 LAB — CULTURE, URINE: Urine Culture, Routine: 25000
